# Patient Record
Sex: Male | Born: 1960 | Race: Black or African American | Hispanic: No | Marital: Single | Smoking: Former smoker
Health system: Southern US, Community
[De-identification: ages and names within clinical notes are randomized; demographics above are authoritative.]

## PROBLEM LIST (undated history)

## (undated) DIAGNOSIS — Z8619 Personal history of other infectious and parasitic diseases: Secondary | ICD-10-CM

## (undated) DIAGNOSIS — I10 Essential (primary) hypertension: Secondary | ICD-10-CM

## (undated) DIAGNOSIS — I252 Old myocardial infarction: Secondary | ICD-10-CM

## (undated) DIAGNOSIS — C801 Malignant (primary) neoplasm, unspecified: Secondary | ICD-10-CM

## (undated) HISTORY — DX: Personal history of other infectious and parasitic diseases: Z86.19

## (undated) HISTORY — DX: Old myocardial infarction: I25.2

---

## 2010-12-15 ENCOUNTER — Ambulatory Visit: Payer: Self-pay | Admitting: Internal Medicine

## 2010-12-20 ENCOUNTER — Inpatient Hospital Stay: Payer: Self-pay | Admitting: Internal Medicine

## 2010-12-22 ENCOUNTER — Inpatient Hospital Stay: Payer: Self-pay | Admitting: Internal Medicine

## 2010-12-23 HISTORY — PX: TRACHEOSTOMY: SUR1362

## 2010-12-24 LAB — PATHOLOGY REPORT

## 2010-12-30 ENCOUNTER — Ambulatory Visit: Payer: Self-pay | Admitting: Internal Medicine

## 2011-01-06 ENCOUNTER — Inpatient Hospital Stay: Payer: Self-pay | Admitting: Internal Medicine

## 2011-01-15 ENCOUNTER — Ambulatory Visit: Payer: Self-pay | Admitting: Internal Medicine

## 2011-01-16 ENCOUNTER — Inpatient Hospital Stay: Payer: Self-pay | Admitting: Internal Medicine

## 2011-02-02 ENCOUNTER — Inpatient Hospital Stay: Payer: Self-pay | Admitting: Internal Medicine

## 2011-02-11 ENCOUNTER — Inpatient Hospital Stay: Payer: Self-pay | Admitting: Internal Medicine

## 2011-02-14 ENCOUNTER — Ambulatory Visit: Payer: Self-pay | Admitting: Internal Medicine

## 2011-03-17 ENCOUNTER — Ambulatory Visit: Payer: Self-pay | Admitting: Internal Medicine

## 2011-03-18 LAB — BASIC METABOLIC PANEL
Calcium, Total: 8.5 mg/dL (ref 8.5–10.1)
Chloride: 107 mmol/L (ref 98–107)
Co2: 27 mmol/L (ref 21–32)
Creatinine: 1.28 mg/dL (ref 0.60–1.30)
Potassium: 3.6 mmol/L (ref 3.5–5.1)
Sodium: 141 mmol/L (ref 136–145)

## 2011-03-18 LAB — CBC CANCER CENTER
Basophil #: 0 x10 3/mm (ref 0.0–0.1)
Basophil %: 0.7 %
Eosinophil #: 0 x10 3/mm (ref 0.0–0.7)
Eosinophil %: 0.9 %
HCT: 29.9 % — ABNORMAL LOW (ref 40.0–52.0)
HGB: 10.3 g/dL — ABNORMAL LOW (ref 13.0–18.0)
Lymphocyte #: 0.8 x10 3/mm — ABNORMAL LOW (ref 1.0–3.6)
MCH: 33.5 pg (ref 26.0–34.0)
MCHC: 34.3 g/dL (ref 32.0–36.0)
MCV: 98 fL (ref 80–100)
Monocyte #: 0.3 x10 3/mm (ref 0.0–0.7)
Neutrophil #: 2.1 x10 3/mm (ref 1.4–6.5)
Platelet: 283 x10 3/mm (ref 150–440)
RBC: 3.06 10*6/uL — ABNORMAL LOW (ref 4.40–5.90)

## 2011-03-25 LAB — CBC CANCER CENTER
Eosinophil %: 0.5 %
HCT: 31 % — ABNORMAL LOW (ref 40.0–52.0)
Lymphocyte #: 0.5 x10 3/mm — ABNORMAL LOW (ref 1.0–3.6)
MCH: 33.6 pg (ref 26.0–34.0)
MCHC: 33.9 g/dL (ref 32.0–36.0)
MCV: 99 fL (ref 80–100)
Monocyte #: 0.4 x10 3/mm (ref 0.0–0.7)
Monocyte %: 10.8 %
Neutrophil #: 2.8 x10 3/mm (ref 1.4–6.5)
Platelet: 257 x10 3/mm (ref 150–440)
RBC: 3.13 10*6/uL — ABNORMAL LOW (ref 4.40–5.90)

## 2011-03-25 LAB — BASIC METABOLIC PANEL
Anion Gap: 8 (ref 7–16)
BUN: 15 mg/dL (ref 7–18)
Calcium, Total: 8.6 mg/dL (ref 8.5–10.1)
Creatinine: 1.14 mg/dL (ref 0.60–1.30)
Glucose: 97 mg/dL (ref 65–99)
Sodium: 141 mmol/L (ref 136–145)

## 2011-04-01 LAB — BASIC METABOLIC PANEL
BUN: 16 mg/dL (ref 7–18)
Calcium, Total: 8.8 mg/dL (ref 8.5–10.1)
Creatinine: 1.14 mg/dL (ref 0.60–1.30)
EGFR (African American): >60
EGFR (Non-African Amer.): >60
Glucose: 106 mg/dL — ABNORMAL HIGH (ref 65–99)
Potassium: 3.9 mmol/L (ref 3.5–5.1)
Sodium: 140 mmol/L (ref 136–145)

## 2011-04-01 LAB — CBC CANCER CENTER
Basophil #: 0 x10 3/mm (ref 0.0–0.1)
Eosinophil #: 0 x10 3/mm (ref 0.0–0.7)
Eosinophil %: 0.4 %
Lymphocyte #: 0.3 x10 3/mm — ABNORMAL LOW (ref 1.0–3.6)
MCH: 33.3 pg (ref 26.0–34.0)
MCHC: 33.3 g/dL (ref 32.0–36.0)
MCV: 100 fL (ref 80–100)
Monocyte #: 0.3 x10 3/mm (ref 0.0–0.7)
Monocyte %: 7.8 %
Platelet: 266 x10 3/mm (ref 150–440)
RBC: 3.29 10*6/uL — ABNORMAL LOW (ref 4.40–5.90)
RDW: 16.5 % — ABNORMAL HIGH (ref 11.5–14.5)

## 2011-04-08 LAB — BASIC METABOLIC PANEL
Anion Gap: 7 (ref 7–16)
Calcium, Total: 8.8 mg/dL (ref 8.5–10.1)
Chloride: 105 mmol/L (ref 98–107)
Co2: 29 mmol/L (ref 21–32)
Creatinine: 1.08 mg/dL (ref 0.60–1.30)
Potassium: 3.9 mmol/L (ref 3.5–5.1)

## 2011-04-08 LAB — CBC CANCER CENTER
Eosinophil %: 0.8 %
Lymphocyte #: 0.3 x10 3/mm — ABNORMAL LOW (ref 1.0–3.6)
MCV: 100 fL (ref 80–100)
Monocyte %: 13.5 %
Neutrophil %: 75.5 %
Platelet: 251 x10 3/mm (ref 150–440)
RBC: 3.16 10*6/uL — ABNORMAL LOW (ref 4.40–5.90)
WBC: 2.8 x10 3/mm — ABNORMAL LOW (ref 3.8–10.6)

## 2011-04-15 LAB — HEPATIC FUNCTION PANEL A (ARMC)
Alkaline Phosphatase: 88 U/L (ref 50–136)
Bilirubin, Direct: 0.1 mg/dL (ref 0.00–0.20)
Bilirubin,Total: 0.2 mg/dL (ref 0.2–1.0)
SGOT(AST): 17 U/L (ref 15–37)
SGPT (ALT): 18 U/L

## 2011-04-15 LAB — BASIC METABOLIC PANEL
Anion Gap: 10 (ref 7–16)
Calcium, Total: 8.6 mg/dL (ref 8.5–10.1)
Co2: 26 mmol/L (ref 21–32)
Creatinine: 1.09 mg/dL (ref 0.60–1.30)
EGFR (African American): >60
Osmolality: 286 (ref 275–301)
Potassium: 3.7 mmol/L (ref 3.5–5.1)

## 2011-04-15 LAB — CBC CANCER CENTER
Basophil #: 0 x10 3/mm (ref 0.0–0.1)
Eosinophil #: 0 x10 3/mm (ref 0.0–0.7)
Eosinophil %: 1.1 %
HCT: 31.4 % — ABNORMAL LOW (ref 40.0–52.0)
Lymphocyte #: 0.2 x10 3/mm — ABNORMAL LOW (ref 1.0–3.6)
MCH: 34.6 pg — ABNORMAL HIGH (ref 26.0–34.0)
MCHC: 34.4 g/dL (ref 32.0–36.0)
MCV: 101 fL — ABNORMAL HIGH (ref 80–100)
Monocyte #: 0.4 x10 3/mm (ref 0.0–0.7)
Neutrophil #: 1.8 x10 3/mm (ref 1.4–6.5)
Neutrophil %: 73.8 %
Platelet: 182 x10 3/mm (ref 150–440)
RDW: 15.3 % — ABNORMAL HIGH (ref 11.5–14.5)
WBC: 2.5 x10 3/mm — ABNORMAL LOW (ref 3.8–10.6)

## 2011-04-17 ENCOUNTER — Ambulatory Visit: Payer: Self-pay | Admitting: Internal Medicine

## 2011-04-22 LAB — CBC CANCER CENTER
Basophil %: 0 %
Eosinophil #: 0 x10 3/mm (ref 0.0–0.7)
HCT: 32.1 % — ABNORMAL LOW (ref 40.0–52.0)
HGB: 11 g/dL — ABNORMAL LOW (ref 13.0–18.0)
Lymphocyte %: 5.1 %
Monocyte %: 8.3 %
Neutrophil #: 2.4 x10 3/mm (ref 1.4–6.5)
Neutrophil %: 86.2 %
Platelet: 195 x10 3/mm (ref 150–440)
RDW: 14.8 % — ABNORMAL HIGH (ref 11.5–14.5)
WBC: 2.8 x10 3/mm — ABNORMAL LOW (ref 3.8–10.6)

## 2011-04-22 LAB — BASIC METABOLIC PANEL
Anion Gap: 6 — ABNORMAL LOW (ref 7–16)
BUN: 20 mg/dL — ABNORMAL HIGH (ref 7–18)
Chloride: 104 mmol/L (ref 98–107)
Co2: 30 mmol/L (ref 21–32)
Creatinine: 1.08 mg/dL (ref 0.60–1.30)
Osmolality: 283 (ref 275–301)
Potassium: 4 mmol/L (ref 3.5–5.1)

## 2011-04-29 LAB — CBC CANCER CENTER
Basophil #: 0 x10 3/mm (ref 0.0–0.1)
Eosinophil #: 0 x10 3/mm (ref 0.0–0.7)
HCT: 31.6 % — ABNORMAL LOW (ref 40.0–52.0)
Lymphocyte %: 7.8 %
MCHC: 34.5 g/dL (ref 32.0–36.0)
MCV: 100 fL (ref 80–100)
Monocyte %: 9.9 %
Neutrophil #: 1.4 x10 3/mm (ref 1.4–6.5)
Neutrophil %: 81.5 %
Platelet: 191 x10 3/mm (ref 150–440)
RBC: 3.16 10*6/uL — ABNORMAL LOW (ref 4.40–5.90)
RDW: 14.3 % (ref 11.5–14.5)
WBC: 1.7 x10 3/mm — CL (ref 3.8–10.6)

## 2011-04-29 LAB — HEPATIC FUNCTION PANEL A (ARMC)
Albumin: 3.4 g/dL (ref 3.4–5.0)
Bilirubin, Direct: 0.1 mg/dL (ref 0.00–0.20)
Bilirubin,Total: 0.4 mg/dL (ref 0.2–1.0)
SGPT (ALT): 22 U/L
Total Protein: 6.9 g/dL (ref 6.4–8.2)

## 2011-04-29 LAB — BASIC METABOLIC PANEL
Anion Gap: 6 — ABNORMAL LOW (ref 7–16)
BUN: 16 mg/dL (ref 7–18)
Creatinine: 1.15 mg/dL (ref 0.60–1.30)
EGFR (African American): >60
EGFR (Non-African Amer.): >60
Osmolality: 281 (ref 275–301)

## 2011-05-06 LAB — BASIC METABOLIC PANEL
Anion Gap: 5 — ABNORMAL LOW (ref 7–16)
BUN: 18 mg/dL (ref 7–18)
Chloride: 103 mmol/L (ref 98–107)
Creatinine: 1.05 mg/dL (ref 0.60–1.30)
EGFR (African American): >60
EGFR (Non-African Amer.): >60
Glucose: 121 mg/dL — ABNORMAL HIGH (ref 65–99)
Sodium: 138 mmol/L (ref 136–145)

## 2011-05-06 LAB — CBC CANCER CENTER
Basophil %: 0.2 %
Eosinophil #: 0 x10 3/mm (ref 0.0–0.7)
HCT: 31.8 % — ABNORMAL LOW (ref 40.0–52.0)
HGB: 11 g/dL — ABNORMAL LOW (ref 13.0–18.0)
Lymphocyte #: 0.1 x10 3/mm — ABNORMAL LOW (ref 1.0–3.6)
Lymphocyte %: 4.7 %
MCV: 100 fL (ref 80–100)
Monocyte #: 0.1 x10 3/mm (ref 0.0–0.7)
Monocyte %: 7.2 %
Neutrophil #: 1.8 x10 3/mm (ref 1.4–6.5)
RBC: 3.18 10*6/uL — ABNORMAL LOW (ref 4.40–5.90)
RDW: 14.2 % (ref 11.5–14.5)
WBC: 2 x10 3/mm — CL (ref 3.8–10.6)

## 2011-05-14 LAB — CBC CANCER CENTER
Basophil %: 0 %
Eosinophil %: 0.2 %
HCT: 32.6 % — ABNORMAL LOW (ref 40.0–52.0)
HGB: 11.2 g/dL — ABNORMAL LOW (ref 13.0–18.0)
Lymphocyte #: 0.1 x10 3/mm — ABNORMAL LOW (ref 1.0–3.6)
Lymphocyte %: 5.1 %
MCH: 34.3 pg — ABNORMAL HIGH (ref 26.0–34.0)
MCV: 100 fL (ref 80–100)
Neutrophil %: 82.8 %
RBC: 3.25 10*6/uL — ABNORMAL LOW (ref 4.40–5.90)

## 2011-05-14 LAB — BASIC METABOLIC PANEL
Anion Gap: 6 — ABNORMAL LOW (ref 7–16)
Calcium, Total: 8.8 mg/dL (ref 8.5–10.1)
Chloride: 103 mmol/L (ref 98–107)
Co2: 33 mmol/L — ABNORMAL HIGH (ref 21–32)
Creatinine: 1.17 mg/dL (ref 0.60–1.30)
EGFR (African American): >60
EGFR (Non-African Amer.): >60
Osmolality: 284 (ref 275–301)
Sodium: 142 mmol/L (ref 136–145)

## 2011-05-15 ENCOUNTER — Ambulatory Visit: Payer: Self-pay | Admitting: Internal Medicine

## 2011-06-10 LAB — HEPATIC FUNCTION PANEL A (ARMC)
Albumin: 3.5 g/dL (ref 3.4–5.0)
Alkaline Phosphatase: 95 U/L (ref 50–136)
Bilirubin, Direct: 0.1 mg/dL (ref 0.00–0.20)
Total Protein: 7.1 g/dL (ref 6.4–8.2)

## 2011-06-10 LAB — CBC CANCER CENTER
HGB: 10.8 g/dL — ABNORMAL LOW (ref 13.0–18.0)
Lymphocyte #: 0.2 x10 3/mm — ABNORMAL LOW (ref 1.0–3.6)
MCH: 34.1 pg — ABNORMAL HIGH (ref 26.0–34.0)
MCHC: 34 g/dL (ref 32.0–36.0)
MCV: 100 fL (ref 80–100)
Monocyte #: 0.3 x10 3/mm (ref 0.0–0.7)
Monocyte %: 13.3 %
Platelet: 258 x10 3/mm (ref 150–440)
RDW: 16.2 % — ABNORMAL HIGH (ref 11.5–14.5)

## 2011-06-10 LAB — BASIC METABOLIC PANEL
Anion Gap: 7 (ref 7–16)
BUN: 19 mg/dL — ABNORMAL HIGH (ref 7–18)
Calcium, Total: 8.6 mg/dL (ref 8.5–10.1)
Chloride: 104 mmol/L (ref 98–107)
EGFR (African American): >60
EGFR (Non-African Amer.): >60
Osmolality: 282 (ref 275–301)
Potassium: 3.7 mmol/L (ref 3.5–5.1)
Sodium: 140 mmol/L (ref 136–145)

## 2011-06-15 ENCOUNTER — Ambulatory Visit: Payer: Self-pay | Admitting: Internal Medicine

## 2011-07-15 ENCOUNTER — Ambulatory Visit: Payer: Self-pay

## 2011-07-15 ENCOUNTER — Ambulatory Visit: Payer: Self-pay | Admitting: Internal Medicine

## 2011-09-07 ENCOUNTER — Ambulatory Visit: Payer: Self-pay | Admitting: Internal Medicine

## 2011-09-07 LAB — CBC CANCER CENTER
Basophil %: 1.1 %
Eosinophil %: 2.8 %
HGB: 12.5 g/dL — ABNORMAL LOW (ref 13.0–18.0)
Lymphocyte %: 9.8 %
MCH: 32.7 pg (ref 26.0–34.0)
MCV: 100 fL (ref 80–100)
Neutrophil #: 2.5 x10 3/mm (ref 1.4–6.5)
Platelet: 183 x10 3/mm (ref 150–440)
WBC: 3.3 x10 3/mm — ABNORMAL LOW (ref 3.8–10.6)

## 2011-09-07 LAB — BASIC METABOLIC PANEL
Anion Gap: 5 — ABNORMAL LOW (ref 7–16)
BUN: 15 mg/dL (ref 7–18)
Calcium, Total: 8.6 mg/dL (ref 8.5–10.1)
Chloride: 104 mmol/L (ref 98–107)
Co2: 30 mmol/L (ref 21–32)
Creatinine: 1.14 mg/dL (ref 0.60–1.30)
EGFR (African American): >60
EGFR (Non-African Amer.): >60
Glucose: 96 mg/dL (ref 65–99)
Potassium: 3.7 mmol/L (ref 3.5–5.1)

## 2011-09-07 LAB — HEPATIC FUNCTION PANEL A (ARMC)
Albumin: 3.6 g/dL (ref 3.4–5.0)
Alkaline Phosphatase: 91 U/L (ref 50–136)
Bilirubin, Direct: 0.1 mg/dL (ref 0.00–0.20)
SGOT(AST): 25 U/L (ref 15–37)
SGPT (ALT): 29 U/L
Total Protein: 7.3 g/dL (ref 6.4–8.2)

## 2011-09-11 ENCOUNTER — Emergency Department: Payer: Self-pay | Admitting: Emergency Medicine

## 2011-09-11 LAB — CREATININE, SERUM: Creatinine: 1.49 mg/dL — ABNORMAL HIGH (ref 0.60–1.30)

## 2011-09-11 LAB — BASIC METABOLIC PANEL
Calcium, Total: 8.9 mg/dL (ref 8.5–10.1)
Chloride: 104 mmol/L (ref 98–107)
Co2: 27 mmol/L (ref 21–32)
EGFR (African American): >60
Glucose: 96 mg/dL (ref 65–99)
Osmolality: 279 (ref 275–301)
Potassium: 3.8 mmol/L (ref 3.5–5.1)
Sodium: 139 mmol/L (ref 136–145)

## 2011-09-14 ENCOUNTER — Ambulatory Visit: Payer: Self-pay | Admitting: Internal Medicine

## 2011-10-21 ENCOUNTER — Ambulatory Visit: Payer: Self-pay | Admitting: Internal Medicine

## 2011-11-15 ENCOUNTER — Ambulatory Visit: Payer: Self-pay | Admitting: Internal Medicine

## 2012-01-25 ENCOUNTER — Ambulatory Visit: Payer: Self-pay | Admitting: Internal Medicine

## 2012-01-25 LAB — CBC CANCER CENTER
Basophil #: 0 x10 3/mm (ref 0.0–0.1)
Eosinophil #: 0 x10 3/mm (ref 0.0–0.7)
MCH: 32.6 pg (ref 26.0–34.0)
MCV: 100 fL (ref 80–100)
Monocyte #: 0.4 x10 3/mm (ref 0.2–1.0)
Platelet: 187 x10 3/mm (ref 150–440)
RDW: 13 % (ref 11.5–14.5)

## 2012-01-25 LAB — BASIC METABOLIC PANEL
Anion Gap: 9 (ref 7–16)
BUN: 18 mg/dL (ref 7–18)
Calcium, Total: 8.9 mg/dL (ref 8.5–10.1)
Co2: 27 mmol/L (ref 21–32)
EGFR (African American): >60
EGFR (Non-African Amer.): >60
Glucose: 97 mg/dL (ref 65–99)
Potassium: 4 mmol/L (ref 3.5–5.1)
Sodium: 139 mmol/L (ref 136–145)

## 2012-01-25 LAB — HEPATIC FUNCTION PANEL A (ARMC)
Albumin: 3.6 g/dL (ref 3.4–5.0)
Alkaline Phosphatase: 87 U/L (ref 50–136)
Bilirubin, Direct: 0.1 mg/dL (ref 0.00–0.20)
SGPT (ALT): 33 U/L (ref 12–78)
Total Protein: 7.2 g/dL (ref 6.4–8.2)

## 2012-02-14 ENCOUNTER — Ambulatory Visit: Payer: Self-pay | Admitting: Internal Medicine

## 2012-04-18 ENCOUNTER — Ambulatory Visit: Payer: Self-pay | Admitting: Radiation Oncology

## 2012-05-14 ENCOUNTER — Ambulatory Visit: Payer: Self-pay | Admitting: Radiation Oncology

## 2012-05-14 ENCOUNTER — Ambulatory Visit: Payer: Self-pay | Admitting: Internal Medicine

## 2012-05-16 LAB — CBC CANCER CENTER
Basophil #: 0 x10 3/mm (ref 0.0–0.1)
Eosinophil #: 0.1 x10 3/mm (ref 0.0–0.7)
HCT: 38.2 % — ABNORMAL LOW (ref 40.0–52.0)
HGB: 12.7 g/dL — ABNORMAL LOW (ref 13.0–18.0)
MCH: 32.4 pg (ref 26.0–34.0)
MCHC: 33.3 g/dL (ref 32.0–36.0)
MCV: 97 fL (ref 80–100)
Monocyte %: 10.2 %
WBC: 2.9 x10 3/mm — ABNORMAL LOW (ref 3.8–10.6)

## 2012-05-16 LAB — CREATININE, SERUM
EGFR (African American): >60
EGFR (Non-African Amer.): >60

## 2012-05-16 LAB — HEPATIC FUNCTION PANEL A (ARMC)
Alkaline Phosphatase: 84 U/L (ref 50–136)
Bilirubin, Direct: 0.1 mg/dL (ref 0.00–0.20)
Bilirubin,Total: 0.5 mg/dL (ref 0.2–1.0)

## 2012-05-16 LAB — CALCIUM: Calcium, Total: 8.7 mg/dL (ref 8.5–10.1)

## 2012-06-14 ENCOUNTER — Ambulatory Visit: Payer: Self-pay | Admitting: Internal Medicine

## 2012-06-14 ENCOUNTER — Ambulatory Visit: Payer: Self-pay | Admitting: Radiation Oncology

## 2012-07-14 ENCOUNTER — Ambulatory Visit: Payer: Self-pay | Admitting: Internal Medicine

## 2012-09-13 ENCOUNTER — Ambulatory Visit: Payer: Self-pay | Admitting: Internal Medicine

## 2012-09-23 LAB — CBC CANCER CENTER
Basophil #: 0 x10 3/mm (ref 0.0–0.1)
Eosinophil %: 0.9 %
HCT: 40.8 % (ref 40.0–52.0)
HGB: 14 g/dL (ref 13.0–18.0)
Lymphocyte #: 0.4 x10 3/mm — ABNORMAL LOW (ref 1.0–3.6)
MCH: 33 pg (ref 26.0–34.0)
MCHC: 34.3 g/dL (ref 32.0–36.0)
Monocyte #: 0.4 x10 3/mm (ref 0.2–1.0)
Neutrophil #: 2.5 x10 3/mm (ref 1.4–6.5)
Platelet: 200 x10 3/mm (ref 150–440)
RDW: 13.6 % (ref 11.5–14.5)
WBC: 3.4 x10 3/mm — ABNORMAL LOW (ref 3.8–10.6)

## 2012-09-23 LAB — HEPATIC FUNCTION PANEL A (ARMC)
Albumin: 3.7 g/dL (ref 3.4–5.0)
Bilirubin, Direct: 0.1 mg/dL (ref 0.00–0.20)
Bilirubin,Total: 0.5 mg/dL (ref 0.2–1.0)
SGPT (ALT): 32 U/L (ref 12–78)
Total Protein: 7.3 g/dL (ref 6.4–8.2)

## 2012-09-23 LAB — CREATININE, SERUM
Creatinine: 1.38 mg/dL — ABNORMAL HIGH (ref 0.60–1.30)
EGFR (African American): >60
EGFR (Non-African Amer.): 58 — ABNORMAL LOW

## 2012-09-23 LAB — CALCIUM: Calcium, Total: 8.7 mg/dL (ref 8.5–10.1)

## 2012-10-14 ENCOUNTER — Ambulatory Visit: Payer: Self-pay | Admitting: Internal Medicine

## 2012-11-16 ENCOUNTER — Ambulatory Visit: Payer: Self-pay | Admitting: Internal Medicine

## 2013-01-24 ENCOUNTER — Ambulatory Visit: Payer: Self-pay | Admitting: Internal Medicine

## 2013-01-24 LAB — CBC CANCER CENTER
Basophil #: 0 x10 3/mm (ref 0.0–0.1)
Basophil %: 1 %
Eosinophil #: 0.1 x10 3/mm (ref 0.0–0.7)
Eosinophil %: 2.6 %
HCT: 40.9 % (ref 40.0–52.0)
HGB: 13.4 g/dL (ref 13.0–18.0)
Lymphocyte #: 0.5 x10 3/mm — ABNORMAL LOW (ref 1.0–3.6)
MCH: 31.7 pg (ref 26.0–34.0)
MCV: 97 fL (ref 80–100)
Monocyte #: 0.4 x10 3/mm (ref 0.2–1.0)
Monocyte %: 11.9 %
Neutrophil #: 2.3 x10 3/mm (ref 1.4–6.5)
Neutrophil %: 70.1 %
Platelet: 225 x10 3/mm (ref 150–440)
RBC: 4.22 10*6/uL — ABNORMAL LOW (ref 4.40–5.90)
WBC: 3.3 x10 3/mm — ABNORMAL LOW (ref 3.8–10.6)

## 2013-01-24 LAB — CREATININE, SERUM
EGFR (African American): >60
EGFR (Non-African Amer.): >60

## 2013-01-24 LAB — HEPATIC FUNCTION PANEL A (ARMC)
Bilirubin,Total: 0.6 mg/dL (ref 0.2–1.0)
SGOT(AST): 25 U/L (ref 15–37)
Total Protein: 7.5 g/dL (ref 6.4–8.2)

## 2013-01-24 LAB — CALCIUM: Calcium, Total: 8.8 mg/dL (ref 8.5–10.1)

## 2013-02-11 ENCOUNTER — Emergency Department: Payer: Self-pay | Admitting: Internal Medicine

## 2013-02-11 LAB — CBC WITH DIFFERENTIAL/PLATELET
Basophil %: 0.2 %
Eosinophil #: 0 10*3/uL (ref 0.0–0.7)
HCT: 49.3 % (ref 40.0–52.0)
HGB: 16.3 g/dL (ref 13.0–18.0)
Lymphocyte #: 0.2 10*3/uL — ABNORMAL LOW (ref 1.0–3.6)
Lymphocyte %: 1.3 %
MCH: 31.5 pg (ref 26.0–34.0)
MCHC: 33.1 g/dL (ref 32.0–36.0)
MCV: 95 fL (ref 80–100)
Monocyte %: 2.8 %
Neutrophil #: 12.1 10*3/uL — ABNORMAL HIGH (ref 1.4–6.5)
Neutrophil %: 95.7 %
Platelet: 272 10*3/uL (ref 150–440)
RBC: 5.18 10*6/uL (ref 4.40–5.90)

## 2013-02-11 LAB — COMPREHENSIVE METABOLIC PANEL
Albumin: 4.7 g/dL (ref 3.4–5.0)
Alkaline Phosphatase: 103 U/L
Anion Gap: 9 (ref 7–16)
BUN: 24 mg/dL — ABNORMAL HIGH (ref 7–18)
Chloride: 100 mmol/L (ref 98–107)
Co2: 23 mmol/L (ref 21–32)
Creatinine: 1.33 mg/dL — ABNORMAL HIGH (ref 0.60–1.30)
EGFR (African American): >60
EGFR (Non-African Amer.): >60
Glucose: 163 mg/dL — ABNORMAL HIGH (ref 65–99)
Osmolality: 272 (ref 275–301)
Potassium: 3.7 mmol/L (ref 3.5–5.1)
SGOT(AST): 20 U/L (ref 15–37)
SGPT (ALT): 31 U/L (ref 12–78)
Total Protein: 9.1 g/dL — ABNORMAL HIGH (ref 6.4–8.2)

## 2013-02-11 LAB — URINALYSIS, COMPLETE
Leukocyte Esterase: NEGATIVE
Nitrite: NEGATIVE
Protein: 100
RBC,UR: <1 /HPF (ref 0–5)
Specific Gravity: 1.023 (ref 1.003–1.030)

## 2013-02-13 ENCOUNTER — Ambulatory Visit: Payer: Self-pay | Admitting: Internal Medicine

## 2013-03-16 ENCOUNTER — Ambulatory Visit: Payer: Self-pay | Admitting: Internal Medicine

## 2013-08-14 ENCOUNTER — Ambulatory Visit: Payer: Self-pay | Admitting: Radiation Oncology

## 2013-08-14 LAB — CBC CANCER CENTER
BASOS ABS: 0 x10 3/mm (ref 0.0–0.1)
BASOS PCT: 1.2 %
EOS ABS: 0 x10 3/mm (ref 0.0–0.7)
Eosinophil %: 1.3 %
HCT: 41 % (ref 40.0–52.0)
HGB: 13.7 g/dL (ref 13.0–18.0)
LYMPHS ABS: 0.5 x10 3/mm — AB (ref 1.0–3.6)
Lymphocyte %: 20.3 %
MCH: 32.4 pg (ref 26.0–34.0)
MCHC: 33.4 g/dL (ref 32.0–36.0)
MCV: 97 fL (ref 80–100)
Monocyte #: 0.4 x10 3/mm (ref 0.2–1.0)
Monocyte %: 14.1 %
NEUTROS ABS: 1.7 x10 3/mm (ref 1.4–6.5)
Neutrophil %: 63.1 %
Platelet: 236 x10 3/mm (ref 150–440)
RBC: 4.23 10*6/uL — ABNORMAL LOW (ref 4.40–5.90)
RDW: 13.9 % (ref 11.5–14.5)
WBC: 2.7 x10 3/mm — ABNORMAL LOW (ref 3.8–10.6)

## 2013-08-14 LAB — HEPATIC FUNCTION PANEL A (ARMC)
ALBUMIN: 4 g/dL (ref 3.4–5.0)
ALT: 31 U/L (ref 12–78)
Alkaline Phosphatase: 86 U/L
Bilirubin, Direct: 0.1 mg/dL (ref 0.00–0.20)
Bilirubin,Total: 0.3 mg/dL (ref 0.2–1.0)
SGOT(AST): 24 U/L (ref 15–37)
Total Protein: 7.8 g/dL (ref 6.4–8.2)

## 2013-08-14 LAB — CALCIUM: CALCIUM: 9 mg/dL (ref 8.5–10.1)

## 2013-08-14 LAB — CREATININE, SERUM
Creatinine: 1.28 mg/dL (ref 0.60–1.30)
EGFR (Non-African Amer.): >60

## 2013-09-13 ENCOUNTER — Ambulatory Visit: Payer: Self-pay | Admitting: Radiation Oncology

## 2014-02-19 ENCOUNTER — Ambulatory Visit: Payer: Self-pay | Admitting: Internal Medicine

## 2014-02-21 ENCOUNTER — Ambulatory Visit: Payer: Self-pay | Admitting: Internal Medicine

## 2014-02-21 LAB — CBC CANCER CENTER
BASOS PCT: 0.9 %
Basophil #: 0 x10 3/mm (ref 0.0–0.1)
EOS PCT: 0.5 %
Eosinophil #: 0 x10 3/mm (ref 0.0–0.7)
HCT: 43.8 % (ref 40.0–52.0)
HGB: 14.5 g/dL (ref 13.0–18.0)
LYMPHS PCT: 16.7 %
Lymphocyte #: 0.6 x10 3/mm — ABNORMAL LOW (ref 1.0–3.6)
MCH: 32.5 pg (ref 26.0–34.0)
MCHC: 33.2 g/dL (ref 32.0–36.0)
MCV: 98 fL (ref 80–100)
MONO ABS: 0.5 x10 3/mm (ref 0.2–1.0)
Monocyte %: 14.4 %
NEUTROS PCT: 67.5 %
Neutrophil #: 2.4 x10 3/mm (ref 1.4–6.5)
PLATELETS: 249 x10 3/mm (ref 150–440)
RBC: 4.46 10*6/uL (ref 4.40–5.90)
RDW: 13.6 % (ref 11.5–14.5)
WBC: 3.5 x10 3/mm — ABNORMAL LOW (ref 3.8–10.6)

## 2014-02-21 LAB — HEPATIC FUNCTION PANEL A (ARMC)
AST: 31 U/L (ref 15–37)
Albumin: 3.8 g/dL (ref 3.4–5.0)
Alkaline Phosphatase: 86 U/L
Bilirubin,Total: 0.6 mg/dL (ref 0.2–1.0)
SGPT (ALT): 29 U/L
TOTAL PROTEIN: 7.6 g/dL (ref 6.4–8.2)

## 2014-02-21 LAB — CREATININE, SERUM
Creatinine: 1.15 mg/dL (ref 0.60–1.30)
EGFR (Non-African Amer.): >60

## 2014-02-21 LAB — CALCIUM: CALCIUM: 8.7 mg/dL (ref 8.5–10.1)

## 2014-03-16 ENCOUNTER — Ambulatory Visit: Payer: Self-pay | Admitting: Internal Medicine

## 2014-08-22 ENCOUNTER — Ambulatory Visit: Payer: Self-pay | Admitting: Internal Medicine

## 2014-08-22 ENCOUNTER — Other Ambulatory Visit: Payer: Self-pay

## 2014-08-22 ENCOUNTER — Ambulatory Visit: Payer: Self-pay | Admitting: Radiation Oncology

## 2014-09-20 ENCOUNTER — Other Ambulatory Visit: Payer: Self-pay | Admitting: Family Medicine

## 2014-09-20 NOTE — Telephone Encounter (Signed)
Pt contacted office for refill request on the following medications:  Amlodipine Besylate 10mg  and Ramipril 10mg .  [place].  [phone]

## 2014-09-21 MED ORDER — FLUTICASONE PROPIONATE 50 MCG/ACT NA SUSP: 2.0000 | Freq: Every day | NASAL | Status: DC

## 2014-09-21 MED ORDER — RAMIPRIL 10 MG PO CAPS: 10.0000 mg | ORAL_CAPSULE | Freq: Every day | ORAL | Status: DC

## 2014-09-21 MED ORDER — AMLODIPINE BESYLATE 10 MG PO TABS: 10.0000 mg | ORAL_TABLET | Freq: Every day | ORAL | Status: DC

## 2014-09-21 NOTE — Telephone Encounter (Signed)
Last filled by MD on- Fluticasone-04/26/2014 16g x1; Ramipril 10 mg-04/13/2014 #30 x0; Amlodipine 10 mg-04/13/2014 #30 x0 Last Appt: 04/26/2014  Next Appt: none Please advise refill?

## 2014-09-24 ENCOUNTER — Other Ambulatory Visit: Payer: Self-pay | Admitting: Family Medicine

## 2014-09-24 ENCOUNTER — Telehealth: Payer: Self-pay | Admitting: Family Medicine

## 2014-09-24 MED ORDER — RAMIPRIL 10 MG PO CAPS: 10.0000 mg | ORAL_CAPSULE | Freq: Every day | ORAL | Status: DC

## 2014-09-24 MED ORDER — FLUTICASONE PROPIONATE 50 MCG/ACT NA SUSP: 2.0000 | Freq: Every day | NASAL | Status: AC

## 2014-09-24 MED ORDER — AMLODIPINE BESYLATE 10 MG PO TABS: 10.0000 mg | ORAL_TABLET | Freq: Every day | ORAL | Status: DC

## 2014-09-24 NOTE — Telephone Encounter (Signed)
Resent rx's to pharmacy.

## 2014-09-24 NOTE — Telephone Encounter (Signed)
Pt contacted office for refill request on the following medications: To [facility] [address]  amLODipine (NORVASC) 10 MG tablet  fluticasone (FLONASE) 50 MCG/ACT nasal spray  ramipril (ALTACE) 10 MG capsule Thanks TNP

## 2014-10-17 ENCOUNTER — Encounter: Payer: Self-pay | Admitting: Radiation Oncology

## 2014-10-17 ENCOUNTER — Other Ambulatory Visit: Payer: Self-pay | Admitting: *Deleted

## 2014-10-17 ENCOUNTER — Inpatient Hospital Stay: Payer: Medicaid Other | Attending: Internal Medicine

## 2014-10-17 ENCOUNTER — Inpatient Hospital Stay (HOSPITAL_BASED_OUTPATIENT_CLINIC_OR_DEPARTMENT_OTHER): Payer: Medicaid Other | Admitting: Internal Medicine

## 2014-10-17 ENCOUNTER — Ambulatory Visit
Admission: RE | Admit: 2014-10-17 | Discharge: 2014-10-17 | Disposition: A | Discharge status: Home / Self Care | Payer: Medicaid Other | Source: Ambulatory Visit | Attending: Radiation Oncology | Admitting: Radiation Oncology

## 2014-10-17 VITALS — BP 180/96 | HR 74 | Temp 98.2°F | Resp 18 | Ht 66.0 in | Wt 197.3 lb

## 2014-10-17 VITALS — BP 192/98 | HR 95 | Temp 97.3°F | Resp 20 | Ht 66.0 in | Wt 197.9 lb

## 2014-10-17 DIAGNOSIS — C321 Malignant neoplasm of supraglottis: Secondary | ICD-10-CM

## 2014-10-17 DIAGNOSIS — Z79899 Other long term (current) drug therapy: Secondary | ICD-10-CM | POA: Insufficient documentation

## 2014-10-17 DIAGNOSIS — C329 Malignant neoplasm of larynx, unspecified: Secondary | ICD-10-CM

## 2014-10-17 DIAGNOSIS — I10 Essential (primary) hypertension: Secondary | ICD-10-CM | POA: Insufficient documentation

## 2014-10-17 DIAGNOSIS — I252 Old myocardial infarction: Secondary | ICD-10-CM | POA: Diagnosis not present

## 2014-10-17 DIAGNOSIS — Z8521 Personal history of malignant neoplasm of larynx: Secondary | ICD-10-CM | POA: Diagnosis not present

## 2014-10-17 DIAGNOSIS — D72819 Decreased white blood cell count, unspecified: Secondary | ICD-10-CM

## 2014-10-17 DIAGNOSIS — Z87891 Personal history of nicotine dependence: Secondary | ICD-10-CM | POA: Insufficient documentation

## 2014-10-17 HISTORY — DX: Essential (primary) hypertension: I10

## 2014-10-17 HISTORY — DX: Malignant (primary) neoplasm, unspecified: C80.1

## 2014-10-17 LAB — CBC WITH DIFFERENTIAL/PLATELET
BASOS PCT: 2 %
Basophils Absolute: 0 10*3/uL (ref 0–0.1)
EOS ABS: 0.1 10*3/uL (ref 0–0.7)
EOS PCT: 3 %
HCT: 41.4 % (ref 40.0–52.0)
Hemoglobin: 13.9 g/dL (ref 13.0–18.0)
LYMPHS PCT: 22 %
Lymphs Abs: 0.5 10*3/uL — ABNORMAL LOW (ref 1.0–3.6)
MCH: 32.6 pg (ref 26.0–34.0)
MCHC: 33.6 g/dL (ref 32.0–36.0)
MCV: 96.9 fL (ref 80.0–100.0)
Monocytes Absolute: 0.3 10*3/uL (ref 0.2–1.0)
Monocytes Relative: 14 %
NEUTROS PCT: 59 %
Neutro Abs: 1.3 10*3/uL — ABNORMAL LOW (ref 1.4–6.5)
Platelets: 241 10*3/uL (ref 150–440)
RBC: 4.28 MIL/uL — ABNORMAL LOW (ref 4.40–5.90)
RDW: 13.7 % (ref 11.5–14.5)
WBC: 2.2 10*3/uL — AB (ref 3.8–10.6)

## 2014-10-17 LAB — HEPATIC FUNCTION PANEL
ALK PHOS: 70 U/L (ref 38–126)
ALT: 20 U/L (ref 17–63)
AST: 31 U/L (ref 15–41)
Albumin: 4.8 g/dL (ref 3.5–5.0)
Bilirubin, Direct: <0.1 mg/dL — ABNORMAL LOW (ref 0.1–0.5)
TOTAL PROTEIN: 7.9 g/dL (ref 6.5–8.1)
Total Bilirubin: 0.6 mg/dL (ref 0.3–1.2)

## 2014-10-17 LAB — CALCIUM: Calcium: 8.4 mg/dL — ABNORMAL LOW (ref 8.9–10.3)

## 2014-10-17 LAB — CREATININE, SERUM
Creatinine, Ser: 0.97 mg/dL (ref 0.61–1.24)
GFR calc Af Amer: >60 mL/min (ref >60)
GFR calc non Af Amer: >60 mL/min (ref >60)

## 2014-10-17 NOTE — Progress Notes (Signed)
Patient states that overall he has been feeling good. He saw Dr. Baruch Gouty this morning and he stated that things looked good. Patient states that sometimes when he eats certain foods it is difficult for him to swallow them.

## 2014-10-17 NOTE — Progress Notes (Signed)
Radiation Oncology Follow up Note  Name: Terrance Carter   Date:   10/17/2014 MRN:  [id number] DOB: 1961-01-28    This 54 y.o. male presents to the clinic today for follow-up for locally advanced head and neck cancer.  REFERRING PROVIDER: No ref. 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 He is seen today in routine follow-up and is doing well he continues follow-up care with ENT and has had no evidence of disease. He specifically denies head and neck pain. Does have slight dysphasia to large pieces of meat and he is careful to make sure food is cut up properly. He's had no choking episodes..patient voices still somewhat raspy but unchanged from prior exam.  COMPLICATIONS OF TREATMENT: none  FOLLOW UP COMPLIANCE: keeps appointments   PHYSICAL EXAM:  BP 192/98 mmHg  Pulse 95  Temp(Src) 97.3 F (36.3 C)  Resp 20  Ht 5\' 6"  (1.676 m)  Wt 197 lb 13.8 oz (89.75 kg)  BMI 31.95 kg/m2 Oral cavity is clear no oral mucosal lesions are identified indirect mirror examination shows upper airway clear cords approximating well vallecula and base of tongue within normal limits. There is still some shagginess around the true vocal cords. Macro physical exam  RADIOLOGY RESULTS: patient is scheduled for follow-up CT scan in January 2017  PLAN: present time he is doing well with no evidence of disease. He continues close follow-up with ENT and medical oncology. Scheduled for follow-up CT scan in January 2017. I am please was overall progress. I've asked to see him back in 1 year for follow-up at that time will discontinue follow-up care. Patient knows to call sooner with any concerns.  I would like to take this opportunity for allowing me to participate in the care of your patient.Armstead Peaks., MD

## 2014-10-24 NOTE — Progress Notes (Signed)
[place]  Telephone:(336) [phone] Fax:(336) [phone]     ID: Terrance Carter OB: 06-May-1960  MR#: [id number]  CSN#:[id number]  Patient Care Team: Birdie Sons, MD as PCP - General (Family Medicine)  CHIEF COMPLAINT/DIAGNOSIS:  T3/T4 squamous cell carcinoma of supraglottic area - completed 2 cycles of induction chemotherapy with Cisplatin/Taxotere/continuous IV 5-FU on 02/07/11 with excellent response. Completed radiation along with concurrent weekly cisplatin chemotherapy mid-February 2013.    HISTORY OF PRESENT ILLNESS:  Patient returns for continued oncology followup for history of laryngeal cancer. He was seen in Dec 2015. Denies any new pain in the neck or throat. No difficulty swallowing, changes in voice, or any new masses in the neck on self exam. No new dyspnea or stridor. Denies any headaches, imbalance, or falls. No new mood disturbances. No new dyspnea, chest pain, or hemoptysis. No new bone pains. Remains physically active.   REVIEW OF SYSTEMS:   ROS As in HPI above. In addition, no fever, chills or sweats. No new headaches or focal weakness.  No new mood disturbances. No  sore throat, cough, shortness of breath, sputum, hemoptysis or chest pain. No dizziness or palpitation. No abdominal pain, constipation, diarrhea, dysuria or hematuria. No new skin rash or bleeding symptoms. No new paresthesias in extremities. PS ECOG 0.  PAST MEDICAL HISTORY: Reviewed. Past Medical History  Diagnosis Date  . Cancer Laryngeal  . Hypertension           Hypertension.   h/o ?NSTEMI .   History of substance abuse   Supraglottic squamous cell Ca diagnosed Oct 2012 requiring tracheostomy on 12/23/10.  PAST SURGICAL HISTORY: Reviewed. Past Surgical History  Procedure Laterality Date  . Tracheostomy  12/23/10    FAMILY HISTORY: Reviewed. Noncontributory. Denies hematological disorders or malignancy.  SOCIAL HISTORY: Reviewed. Social History  Substance Use Topics  .  Smoking status: Former Research scientist (life sciences)  . Smokeless tobacco: Never Used  . Alcohol Use: Not on file  Has history of smoking. Denies alcohol or current recreational drug usage. Formerly abused marijuana, quit few months ago. He is a Therapist, nutritional by profession.  No Known Allergies  Current Outpatient Prescriptions  Medication Sig Dispense Refill  . amLODipine (NORVASC) 10 MG tablet Take 1 tablet (10 mg total) by mouth daily. 30 tablet 5  . fluticasone (FLONASE) 50 MCG/ACT nasal spray Place 2 sprays into both nostrils daily. 16 g 3  . ramipril (ALTACE) 10 MG capsule Take 1 capsule (10 mg total) by mouth daily. 30 capsule 6   No current facility-administered medications for this visit.    PHYSICAL EXAM: Filed Vitals:   10/17/14 1049  BP: 180/96  Pulse: 74  Temp: 98.2 F (36.8 C)  Resp: 18     Body mass index is 31.86 kg/(m^2).    ECOG FS:0 - Asymptomatic  GENERAL: Patient is alert and oriented and in no acute distress. There is no icterus. HEENT: EOMs intact. Oral exam negative for thrush or lesions. No cervical lymphadenopathy. CVS: S1S2, regular LUNGS: Bilaterally clear to auscultation, no rhonchi. ABDOMEN: Soft, nontender. No hepatomegaly clinically.  NEURO: grossly nonfocal, cranial nerves are intact.   EXTREMITIES: No pedal edema.  LAB RESULTS:    Component Value Date/Time   NA 132* 02/11/2013 0627   K 3.7 02/11/2013 0627   CL 100 02/11/2013 0627   CO2 23 02/11/2013 0627   GLUCOSE 163* 02/11/2013 0627   BUN 24* 02/11/2013 0627   CREATININE 0.97 10/17/2014 0939   CREATININE 1.15 02/21/2014 1210  CALCIUM 8.4* 10/17/2014 0939   CALCIUM 8.7 02/21/2014 1210   PROT 7.9 10/17/2014 0939   PROT 7.6 02/21/2014 1210   ALBUMIN 4.8 10/17/2014 0939   ALBUMIN 3.8 02/21/2014 1210   AST 31 10/17/2014 0939   AST 31 02/21/2014 1210   ALT 20 10/17/2014 0939   ALT 29 02/21/2014 1210   ALKPHOS 70 10/17/2014 0939   ALKPHOS 86 02/21/2014 1210   BILITOT 0.6 10/17/2014 0939   BILITOT 0.6  02/21/2014 1210   GFRNONAA >60 10/17/2014 0939   GFRNONAA >60 08/14/2013 1100   GFRAA >60 10/17/2014 0939   GFRAA >60 08/14/2013 1100    Lab Results  Component Value Date   WBC 2.2* 10/17/2014   NEUTROABS 1.3* 10/17/2014   HGB 13.9 10/17/2014   HCT 41.4 10/17/2014   MCV 96.9 10/17/2014   PLT 241 10/17/2014    STUDIES: 02/19/14 - CT scan of neck. IMPRESSION:  Diffuse thickening of the epiglottis and aryepiglottic folds. Prominent thickening of the glottic and immediate supraglottic region. Findings have an appearance suggestive of post therapy type changes relatively similar to the prior post therapy exam. No surrounding adenopathy. Significant dental disease lower molar roots bilaterally. Mild to slightly moderate mucosal thickening left sphenoid sinus air cell.   ASSESSMENT / PLAN:   1. 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 He is doing steady without any clinical evidence to suggest recurrent or metastatic malignancy. Appetite is good, no unintentional weight loss. Recent CT scan of the neck on December 7 was suggestive of post-therapy changes, no obvious recurrent or metastatic malignancy. Patient also continues to follow with ENT, Dr. Tami Ribas, on regular basis. Given that he is doing well, plan is continued surveillance, and we will see him back in 6 months with repeat labs including CBC, creatinine, calcium, LFT. Next CT scan will be planned in jan 2017 for annual surveillance.   2. Smoking Cessation - patient has quit smoking. 3. Chronic leucopenia and mild neutropenia - asymptomatic, continue to monitor. Have advised him to quit taking alcohol completely and will monitor. 4. In between visits he was advised to call in case of any new symptoms or acute sickness and will be evaluated sooner. He is agreeable  to this plan.    Leia Alf, MD   10/24/2014 11:23 PM

## 2014-11-22 ENCOUNTER — Telehealth: Payer: Self-pay

## 2014-11-22 NOTE — Telephone Encounter (Signed)
Called patient back informed patient we had an appointment available at 3:30 pm with Tawanna Sat per patient his ride couldn't bring him in today at all. Advised patient as stated below that doctor fisher recommends for him to go the urgent care. Patient said he will if not will see [place] tomorrow at 2:15  Thanks,  -Joseline

## 2014-11-22 NOTE — Telephone Encounter (Signed)
Patient was given an appointment tomorrow at 2:15 for Dizziness.  Triage patient, per patient is feeling dizzy, feels like he is going to faint. Drinking a lot of water. Took his Amlodopine blood pressure medicine. Per patient no chest pain or palpitations no other symptoms. No fever. Thinks probably something he ate might have something to do with it but sure.  Please advise.  Thanks,  -Eliot Bencivenga

## 2014-11-22 NOTE — Telephone Encounter (Signed)
Patient needs to come in this afternoon to see Tawanna Sat, or go to urgent care

## 2014-11-23 ENCOUNTER — Encounter: Payer: Self-pay | Admitting: Family Medicine

## 2014-11-23 ENCOUNTER — Other Ambulatory Visit: Payer: Self-pay | Admitting: Family Medicine

## 2014-11-23 ENCOUNTER — Ambulatory Visit (INDEPENDENT_AMBULATORY_CARE_PROVIDER_SITE_OTHER): Payer: Medicaid Other | Admitting: Family Medicine

## 2014-11-23 VITALS — BP 164/88 | HR 88 | Temp 97.9°F | Resp 16 | Ht 67.0 in | Wt 202.0 lb

## 2014-11-23 DIAGNOSIS — I10 Essential (primary) hypertension: Secondary | ICD-10-CM

## 2014-11-23 DIAGNOSIS — R42 Dizziness and giddiness: Secondary | ICD-10-CM | POA: Diagnosis not present

## 2014-11-23 DIAGNOSIS — H539 Unspecified visual disturbance: Secondary | ICD-10-CM | POA: Insufficient documentation

## 2014-11-23 DIAGNOSIS — C320 Malignant neoplasm of glottis: Secondary | ICD-10-CM | POA: Insufficient documentation

## 2014-11-23 DIAGNOSIS — R1310 Dysphagia, unspecified: Secondary | ICD-10-CM | POA: Insufficient documentation

## 2014-11-23 DIAGNOSIS — Z1211 Encounter for screening for malignant neoplasm of colon: Secondary | ICD-10-CM

## 2014-11-23 DIAGNOSIS — R079 Chest pain, unspecified: Secondary | ICD-10-CM | POA: Insufficient documentation

## 2014-11-23 DIAGNOSIS — D2262 Melanocytic nevi of left upper limb, including shoulder: Secondary | ICD-10-CM | POA: Insufficient documentation

## 2014-11-23 DIAGNOSIS — Z8521 Personal history of malignant neoplasm of larynx: Secondary | ICD-10-CM | POA: Insufficient documentation

## 2014-11-23 DIAGNOSIS — L910 Hypertrophic scar: Secondary | ICD-10-CM | POA: Insufficient documentation

## 2014-11-23 DIAGNOSIS — L732 Hidradenitis suppurativa: Secondary | ICD-10-CM | POA: Insufficient documentation

## 2014-11-23 DIAGNOSIS — I519 Heart disease, unspecified: Secondary | ICD-10-CM | POA: Insufficient documentation

## 2014-11-23 MED ORDER — LISINOPRIL-HYDROCHLOROTHIAZIDE 10-12.5 MG PO TABS: 1.0000 | ORAL_TABLET | Freq: Every day | ORAL | Status: DC

## 2014-11-23 NOTE — Patient Instructions (Addendum)
Take OTC Miralax to clear out bowels.

## 2014-11-23 NOTE — Progress Notes (Signed)
Patient ID: Terrance Carter, male   DOB: 16-Dec-1960, 54 y.o.   MRN: [id number]   Terrance Carter  MRN: [id number] DOB: Sep 08, 1960  Subjective:  Dizziness This is a new problem. The current episode started in the past 7 days. The problem occurs intermittently. The problem has been gradually worsening. Associated symptoms include headaches. Pertinent negatives include no chest pain, congestion, coughing, nausea, neck pain, numbness, visual change or vomiting.  Patient reports that his BP has been running high the past few days (around 160s/70s).  Patient reports that he took 3 doses of amlodipine yesterday to help his BP go down. Dizziness usually flares up when he stands quickly. Does not occur when sitting or lying down. Is not triggered by head turning.   He also inquires about getting a bowel cleansing. He states he feels bloated a lot and thinks he needs a good cleansing. No constipation, diarrhea or blood in stool. Has never had a colonoscopy.   Patient Active Problem List   Diagnosis Date Noted  . Cancer of vocal cord 11/23/2014  . Change in vision 11/23/2014  . Chest pain 11/23/2014  . Dysphagia 11/23/2014  . Heart disease 11/23/2014  . Hidradenitis suppurativa 11/23/2014  . Hypertension 11/23/2014  . Keloid 11/23/2014  . 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 Cancer Laryngeal  . Hypertension   . Heart disease   . History of chicken pox   . History of mumps   . History of measles     Social History   Social History  . Marital Status: Single    Spouse Name: N/A  . Number of Children: N/A  . Years of Education: N/A   Occupational History  . Not on file.   Social History Main Topics  . Smoking status: Former Research scientist (life sciences)  . Smokeless tobacco: Never Used  . Alcohol Use: Not on file  . Drug Use: Not on file  . Sexual Activity: Not on file   Other Topics Concern  . Not on file   Social History Narrative    Outpatient Prescriptions  Prior to Visit  Medication Sig Dispense Refill  . amLODipine (NORVASC) 10 MG tablet Take 1 tablet (10 mg total) by mouth daily. 30 tablet 5  . fluticasone (FLONASE) 50 MCG/ACT nasal spray Place 2 sprays into both nostrils daily. 16 g 3  . ramipril (ALTACE) 10 MG capsule Take 1 capsule (10 mg total) by mouth daily. 30 capsule 6   No facility-administered medications prior to visit.    No Known Allergies  Review of Systems  HENT: Negative for congestion.   Respiratory: Negative for cough.   Cardiovascular: Negative for chest pain.  Gastrointestinal: Negative for nausea and vomiting.  Musculoskeletal: Negative for neck pain.  Neurological: Positive for dizziness and headaches. Negative for numbness.   Objective:  BP 164/88 mmHg  Pulse 88  Temp(Src) 97.9 F (36.6 C)  Resp 16  Ht 5\' 7"  (1.702 m)  Wt 202 lb (91.627 kg)  BMI 31.63 kg/m2  Physical Exam  General Appearance:    Alert, cooperative, no distress  Eyes:    PERRL, conjunctiva/corneas clear, EOM's intact       Lungs:     Clear to auscultation bilaterally, respirations unlabored  Heart:    Regular rate and rhythm  Neurologic:   Awake, alert, oriented x 3. No apparent focal neurological           defect.  Assessment and Plan :  1. Colon cancer screening Has never had a colonoscopy and he would like a bowel cleansing.  - Ambulatory referral to Gastroenterology  2. Hypertension, unspecified Suspect BP has been labile causing dizziness. Had CBC and cr checked last month at cancer center which were normal.   Change ramipril to lisinopril-hctz 10.12.5 continue 10mg  amlodipine. Return for BP check in about 3 weeks.  Call if symptoms change or if not rapidly improving.     3. Dizziness Probably secondary to labile BP   Lelon Huh, MD [facility] 11/23/2014 2:21 PM

## 2014-11-23 NOTE — Telephone Encounter (Signed)
Rx refilled when pt was in office today.

## 2014-11-23 NOTE — Telephone Encounter (Signed)
Pt called stating he needs  A refill on the following medication. [facility] on [address]  amLODipine (NORVASC) 10 MG tablet ramipril (ALTACE) 10 MG capsule

## 2014-11-27 ENCOUNTER — Telehealth: Payer: Self-pay | Admitting: Gastroenterology

## 2014-11-27 NOTE — Telephone Encounter (Signed)
Patient returning call for colonoscopy screening. Please call him back at [phone]

## 2014-11-28 NOTE — Telephone Encounter (Signed)
Phoned patient for colon triage, no answer left message to contact office  °

## 2014-12-10 NOTE — Telephone Encounter (Signed)
Mailed letter to patient to contact office.

## 2015-03-25 ENCOUNTER — Ambulatory Visit: Payer: Medicaid Other

## 2015-03-26 ENCOUNTER — Other Ambulatory Visit: Payer: Self-pay | Admitting: *Deleted

## 2015-03-26 ENCOUNTER — Inpatient Hospital Stay: Payer: Medicaid Other

## 2015-03-26 ENCOUNTER — Ambulatory Visit: Admission: RE | Admit: 2015-03-26 | Payer: Medicaid Other | Source: Ambulatory Visit

## 2015-03-26 DIAGNOSIS — C320 Malignant neoplasm of glottis: Secondary | ICD-10-CM

## 2015-04-08 ENCOUNTER — Ambulatory Visit
Admission: RE | Admit: 2015-04-08 | Discharge: 2015-04-08 | Disposition: A | Discharge status: Home / Self Care | Payer: Medicaid Other | Source: Ambulatory Visit | Attending: Internal Medicine | Admitting: Internal Medicine

## 2015-04-08 ENCOUNTER — Inpatient Hospital Stay: Payer: Medicaid Other

## 2015-04-08 DIAGNOSIS — I10 Essential (primary) hypertension: Secondary | ICD-10-CM | POA: Diagnosis not present

## 2015-04-08 DIAGNOSIS — Z87891 Personal history of nicotine dependence: Secondary | ICD-10-CM | POA: Insufficient documentation

## 2015-04-08 DIAGNOSIS — C329 Malignant neoplasm of larynx, unspecified: Secondary | ICD-10-CM

## 2015-04-08 DIAGNOSIS — Z923 Personal history of irradiation: Secondary | ICD-10-CM | POA: Diagnosis not present

## 2015-04-08 DIAGNOSIS — I252 Old myocardial infarction: Secondary | ICD-10-CM | POA: Diagnosis not present

## 2015-04-08 DIAGNOSIS — Z79899 Other long term (current) drug therapy: Secondary | ICD-10-CM | POA: Diagnosis not present

## 2015-04-08 DIAGNOSIS — Z9221 Personal history of antineoplastic chemotherapy: Secondary | ICD-10-CM | POA: Diagnosis not present

## 2015-04-08 DIAGNOSIS — C321 Malignant neoplasm of supraglottis: Secondary | ICD-10-CM | POA: Diagnosis present

## 2015-04-08 DIAGNOSIS — D72819 Decreased white blood cell count, unspecified: Secondary | ICD-10-CM | POA: Diagnosis not present

## 2015-04-08 DIAGNOSIS — C320 Malignant neoplasm of glottis: Secondary | ICD-10-CM

## 2015-04-08 DIAGNOSIS — R4702 Dysphasia: Secondary | ICD-10-CM | POA: Diagnosis not present

## 2015-04-08 DIAGNOSIS — Z8521 Personal history of malignant neoplasm of larynx: Secondary | ICD-10-CM | POA: Insufficient documentation

## 2015-04-08 LAB — COMPREHENSIVE METABOLIC PANEL
ALT: 19 U/L (ref 17–63)
ANION GAP: 9 (ref 5–15)
AST: 24 U/L (ref 15–41)
Albumin: 4.4 g/dL (ref 3.5–5.0)
Alkaline Phosphatase: 60 U/L (ref 38–126)
BILIRUBIN TOTAL: 0.6 mg/dL (ref 0.3–1.2)
BUN: 19 mg/dL (ref 6–20)
CO2: 25 mmol/L (ref 22–32)
Calcium: 9.1 mg/dL (ref 8.9–10.3)
Chloride: 103 mmol/L (ref 101–111)
Creatinine, Ser: 1.27 mg/dL — ABNORMAL HIGH (ref 0.61–1.24)
Glucose, Bld: 108 mg/dL — ABNORMAL HIGH (ref 65–99)
POTASSIUM: 3.9 mmol/L (ref 3.5–5.1)
Sodium: 137 mmol/L (ref 135–145)
TOTAL PROTEIN: 7.6 g/dL (ref 6.5–8.1)

## 2015-04-08 LAB — CBC WITH DIFFERENTIAL/PLATELET
Basophils Absolute: 0 10*3/uL (ref 0–0.1)
Basophils Relative: 1 %
EOS PCT: 1 %
Eosinophils Absolute: 0 10*3/uL (ref 0–0.7)
HEMATOCRIT: 40.6 % (ref 40.0–52.0)
Hemoglobin: 13.8 g/dL (ref 13.0–18.0)
LYMPHS PCT: 15 %
Lymphs Abs: 0.5 10*3/uL — ABNORMAL LOW (ref 1.0–3.6)
MCH: 32.4 pg (ref 26.0–34.0)
MCHC: 33.9 g/dL (ref 32.0–36.0)
MCV: 95.5 fL (ref 80.0–100.0)
MONO ABS: 0.5 10*3/uL (ref 0.2–1.0)
Monocytes Relative: 14 %
Neutro Abs: 2.3 10*3/uL (ref 1.4–6.5)
Neutrophils Relative %: 69 %
PLATELETS: 230 10*3/uL (ref 150–440)
RBC: 4.25 MIL/uL — ABNORMAL LOW (ref 4.40–5.90)
RDW: 13.1 % (ref 11.5–14.5)
WBC: 3.3 10*3/uL — AB (ref 3.8–10.6)

## 2015-04-08 MED ORDER — IOHEXOL 300 MG/ML  SOLN
75.0000 mL | Freq: Once | INTRAMUSCULAR | Status: AC | PRN
  Administered 2015-04-08: 75 mL via INTRAVENOUS

## 2015-04-09 ENCOUNTER — Inpatient Hospital Stay: Payer: Medicaid Other

## 2015-04-10 ENCOUNTER — Encounter: Payer: Self-pay | Admitting: Internal Medicine

## 2015-04-10 ENCOUNTER — Inpatient Hospital Stay: Payer: Medicaid Other | Attending: Internal Medicine | Admitting: Internal Medicine

## 2015-04-10 VITALS — BP 207/128 | HR 90 | Temp 98.3°F | Resp 18 | Ht 67.0 in | Wt 210.8 lb

## 2015-04-10 DIAGNOSIS — Z87891 Personal history of nicotine dependence: Secondary | ICD-10-CM | POA: Diagnosis not present

## 2015-04-10 DIAGNOSIS — D72819 Decreased white blood cell count, unspecified: Secondary | ICD-10-CM | POA: Diagnosis not present

## 2015-04-10 DIAGNOSIS — Z8521 Personal history of malignant neoplasm of larynx: Secondary | ICD-10-CM | POA: Diagnosis not present

## 2015-04-10 DIAGNOSIS — C760 Malignant neoplasm of head, face and neck: Secondary | ICD-10-CM

## 2015-04-10 DIAGNOSIS — I10 Essential (primary) hypertension: Secondary | ICD-10-CM

## 2015-04-10 DIAGNOSIS — R4702 Dysphasia: Secondary | ICD-10-CM | POA: Diagnosis not present

## 2015-04-10 DIAGNOSIS — Z79899 Other long term (current) drug therapy: Secondary | ICD-10-CM

## 2015-04-10 NOTE — Progress Notes (Signed)
[place]  Telephone:(336) [phone] Fax:(336) [phone]     ID: Nyra Capes OB: June 12, 1960  MR#: [id number]  OYD#:[id number]  Patient Care Team: Birdie Sons, MD as PCP - General (Family Medicine) Yolonda Kida, MD as Consulting Physician (Cardiology) Leia Alf, MD as Attending Physician (Internal Medicine)  CHIEF COMPLAINT/DIAGNOSIS:  T3/T4 squamous cell carcinoma of supraglottic area - completed 2 cycles of induction chemotherapy with Cisplatin/Taxotere/continuous IV 5-FU on 02/07/11 with excellent response. Completed radiation along with concurrent weekly cisplatin chemotherapy mid-February 2013.    HISTORY OF PRESENT ILLNESS:  Mr. Terrance Carter is a 55 year old gentleman who returns to our clinic for a follow-up appointment, almost 4 years after completion of treatment for head and neck cancer. He has done reasonably well since his last appointment. He has occasional sensation of food stuck in his throat, but denies cough, shortness of breath. He denies any new masses or lesions in his mouth or neck. He occasionally complains of shooting pain, which originates from the left shoulder and radiates to the base of the skull, usually related to strenuous physical activities.  REVIEW OF SYSTEMS:   ROS As in HPI above. In addition, no fever, chills or sweats. No new headaches or focal weakness.  No new mood disturbances. No  sore throat, cough, shortness of breath, sputum, hemoptysis or chest pain. No dizziness or palpitation. No abdominal pain, constipation, diarrhea, dysuria or hematuria. No new skin rash or bleeding symptoms. No new paresthesias in extremities. PS ECOG 0.  PAST MEDICAL HISTORY: Reviewed. Past Medical History  Diagnosis Date  . Cancer (HCC) Laryngeal  . History of chicken pox   . History of mumps   . History of measles   . MI, old   . Hypertension           Hypertension.   h/o ?NSTEMI .   History of substance abuse   Supraglottic squamous cell  Ca diagnosed Oct 2012 requiring tracheostomy on 12/23/10.  PAST SURGICAL HISTORY: Reviewed. Past Surgical History  Procedure Laterality Date  . Tracheostomy  12/23/10    for SCC of supraglottis. Dr. Tami Ribas    FAMILY HISTORY: Reviewed. Noncontributory. Denies hematological disorders or malignancy.  SOCIAL HISTORY: Reviewed. Social History  Substance Use Topics  . Smoking status: Former Research scientist (life sciences)  . Smokeless tobacco: Never Used  . Alcohol Use: 0.0 oz/week    0 Standard drinks or equivalent per week     Comment: Drinks wine once a month  Has history of smoking. Denies alcohol or current recreational drug usage. Formerly abused marijuana, quit few months ago. He is a Therapist, nutritional by profession.  No Known Allergies  Current Outpatient Prescriptions  Medication Sig Dispense Refill  . amLODipine (NORVASC) 10 MG tablet Take 1 tablet (10 mg total) by mouth daily. 30 tablet 5  . fluticasone (FLONASE) 50 MCG/ACT nasal spray Place 2 sprays into both nostrils daily. 16 g 3  . lisinopril-hydrochlorothiazide (PRINZIDE,ZESTORETIC) 10-12.5 MG per tablet Take 1 tablet by mouth daily. 30 tablet 3   No current facility-administered medications for this visit.    PHYSICAL EXAM: Filed Vitals:   04/10/15 1457  BP: 207/128  Pulse: 90  Temp: 98.3 F (36.8 C)  Resp: 18   Body mass index is 33 kg/(m^2).    ECOG FS:0 - Asymptomatic  GENERAL: Patient is an African-American male, who is alert and oriented and in no acute distress. There is no icterus. HEENT: EOMs intact. Oral exam negative for thrush or lesions. No cervical lymphadenopathy. Status  post tracheostomy CVS: S1S2, regular LUNGS: Bilaterally clear to auscultation, no rhonchi. ABDOMEN: Soft, nontender. No hepatomegaly clinically.  NEURO: grossly nonfocal, cranial nerves are intact.   EXTREMITIES: No pedal edema. Musculoskeletal:  LAB RESULTS: Recent Results (from the past 2160 hour(s))  CBC with Differential/Platelet     Status: Abnormal     Collection Time: 04/08/15  9:25 AM  Result Value Ref Range   WBC 3.3 (L) 3.8 - 10.6 K/uL   RBC 4.25 (L) 4.40 - 5.90 MIL/uL   Hemoglobin 13.8 13.0 - 18.0 g/dL   HCT 40.6 40.0 - 52.0 %   MCV 95.5 80.0 - 100.0 fL   MCH 32.4 26.0 - 34.0 pg   MCHC 33.9 32.0 - 36.0 g/dL   RDW 13.1 11.5 - 14.5 %   Platelets 230 150 - 440 K/uL   Neutrophils Relative % 69 %   Neutro Abs 2.3 1.4 - 6.5 K/uL   Lymphocytes Relative 15 %   Lymphs Abs 0.5 (L) 1.0 - 3.6 K/uL   Monocytes Relative 14 %   Monocytes Absolute 0.5 0.2 - 1.0 K/uL   Eosinophils Relative 1 %   Eosinophils Absolute 0.0 0 - 0.7 K/uL   Basophils Relative 1 %   Basophils Absolute 0.0 0 - 0.1 K/uL  Comprehensive metabolic panel     Status: Abnormal   Collection Time: 04/08/15  9:25 AM  Result Value Ref Range   Sodium 137 135 - 145 mmol/L   Potassium 3.9 3.5 - 5.1 mmol/L   Chloride 103 101 - 111 mmol/L   CO2 25 22 - 32 mmol/L   Glucose, Bld 108 (H) 65 - 99 mg/dL   BUN 19 6 - 20 mg/dL   Creatinine, Ser 1.27 (H) 0.61 - 1.24 mg/dL   Calcium 9.1 8.9 - 10.3 mg/dL   Total Protein 7.6 6.5 - 8.1 g/dL   Albumin 4.4 3.5 - 5.0 g/dL   AST 24 15 - 41 U/L   ALT 19 17 - 63 U/L   Alkaline Phosphatase 60 38 - 126 U/L   Total Bilirubin 0.6 0.3 - 1.2 mg/dL   GFR calc non Af Amer >60 >60 mL/min   GFR calc Af Amer >60 >60 mL/min    Comment: (NOTE) The eGFR has been calculated using the CKD EPI equation. This calculation has not been validated in all clinical situations. eGFR's persistently <60 mL/min signify possible Chronic Kidney Disease.    Anion gap 9 5 - 15    Lab Results  Component Value Date   WBC 3.3* 04/08/2015   NEUTROABS 2.3 04/08/2015   HGB 13.8 04/08/2015   HCT 40.6 04/08/2015   MCV 95.5 04/08/2015   PLT 230 04/08/2015   .last STUDIES: 04/08/2015 EXAM: CT NECK WITH CONTRAST  TECHNIQUE: Multidetector CT imaging of the neck was performed using the standard protocol following the bolus administration of  intravenous contrast.  CONTRAST: 60m OMNIPAQUE IOHEXOL 300 MG/ML SOLN  COMPARISON: 02/19/2014 and earlier.  FINDINGS: Pharynx and larynx: Diffuse pharyngeal mucosal space soft tissue thickening, relatively sparing the nasopharynx, appears stable since 02/19/2014. No discrete pharyngeal or laryngeal hyper enhancement or mass. 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 Stable thyroid cartilage. Negative superior parapharyngeal spaces. Trace chronic retropharyngeal effusion is unchanged.  Salivary glands: Sublingual space, submandibular glands, and parotid glands are within normal limits.  Thyroid: Negative.  Lymph nodes: No cervical lymphadenopathy. Scattered small cervical lymph nodes at all nodal levels appear stable and within normal limits. No cervical lymphadenopathy.  Vascular: Major vascular structures in the neck and at the skullbase are patent.  Limited intracranial: Negative.  Visualized orbits: Negative.  Mastoids and visualized paranasal sinuses: Clear.  Skeleton: Stable visualized osseous structures.  Upper chest: Negative visualized superior mediastinum. Lung apices are stable and negative. No axillary lymphadenopathy.  IMPRESSION: Satisfactory post treatment appearance of the neck. Post radiation changes to the pharyngeal mucosal space.  No residual laryngeal/pharyngeal mass or lymphadenopathy.   Electronically Signed  By: Genevie Ann M.D.  On: 04/08/2015 13:01   I personally reviewed the CT scan images.  ASSESSMENT / PLAN:   1. T3/T4 squamous cell carcinoma of supraglottic area, status post 2 cycles of induction chemotherapy with cisplatin/Taxotere/continuous 5-FU infusion with excellent response to chemotherapy -   Patient appears to be in complete remission. His current complaints are likely of musculoskeletal origin, and unrelated to the previously treated head and neck cancer. He completed treatment more than 3  years ago, as such we should switch to annual imaging for the next 2 years. We should continue to see Mr. Dotson every 6 months in our clinic.  2. Smoking Cessation - patient has quit smoking. 3. Chronic leucopenia and mild neutropenia - asymptomatic, continue to monitor.  4. Dysphasia-appears to be mild, without signs, suspicious for aspiration. I advised him to discuss this issue with his ENT doctors, however, if he decides to seek another opinion, we will refer him to our speech and swallow evaluation. So far he has been able to manage his occasional sensations by drinking water/liquids 5. Hypertension-patient will take his antihypertensive medications after the visit. He is currently asymptomatic, no need for immediate intervention. 6. In between visits he was advised to call in case of any new symptoms or acute sickness and will be evaluated sooner. He is agreeable to this plan.    Roxana Hires, MD   04/10/2015 2:44 PM

## 2015-04-10 NOTE — Progress Notes (Signed)
Pt states he is doing well but notices that he had diccult with bread feels like stuck in throat. He also notates sometimes his  Left shoulder hurts and mkae pain go up neck and into head area. Eating and drinking good, bowels ok. Still having dryness in his mouth. At end of visit rechecked b/p 200/110. Some better.  Pt states that he did not take either of b/p med because he drank a beer and did not want to mix alcohol with pills.  He will take when he gets home and he does not have HA or any chest pain. He does state that he usually only has a beer once a month.  Advised pt that he should take his b/p meds every day as ordered and if he wants alcohol to take it several hours after his medication has been taken. And try not to drink very often.

## 2015-06-01 IMAGING — CT CT NECK WITH CONTRAST
4 of 5 series · 15 of 33 positions shown, 17 images · IV contrast (isovue)
Comparison: 01/24/2013.

CLINICAL DATA: 53-year-old male restaging laryngeal cancer (T3/T4
squamous cell carcinoma of the supraglottic region). Intermittent
hoarseness. Post chemotherapy and radiation therapy. Initial
encounter.

EXAM:
CT NECK WITH CONTRAST
TECHNIQUE: Multidetector CT imaging of the neck was performed using the
standard protocol following the bolus administration of intravenous
contrast.
CONTRAST:  75 cc Isovue 370.

[Series 2: axial neck · axial · 0.55mm/px · z∈[-428,-292]mm · 4 of 114 slices shown, 5 images]
[im 23/114  soft-tissue]
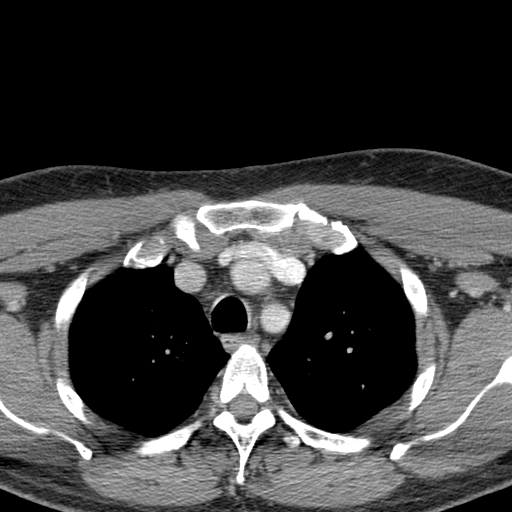
[im 23/114  bone]
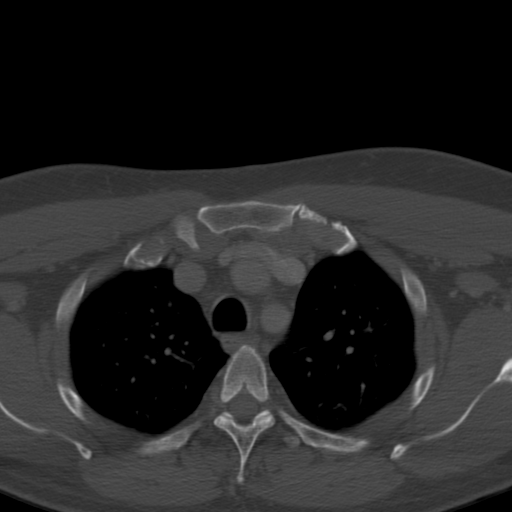
[im 46/114  bone]
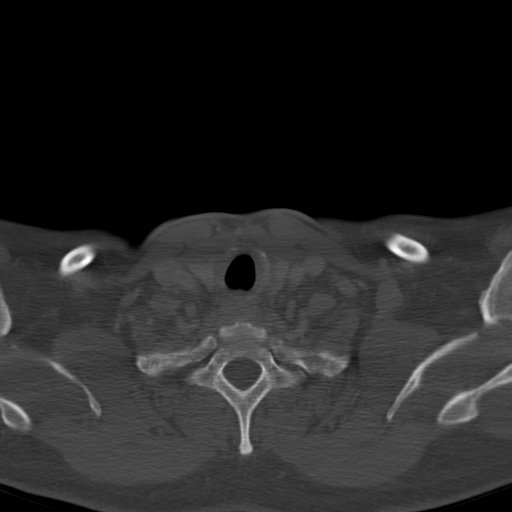
[im 68/114  bone]
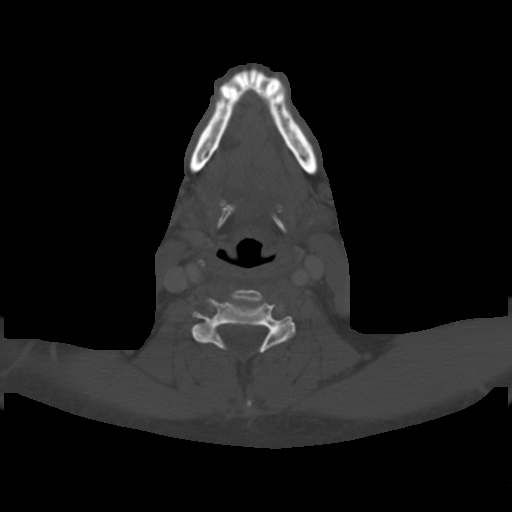
[im 91/114  bone]
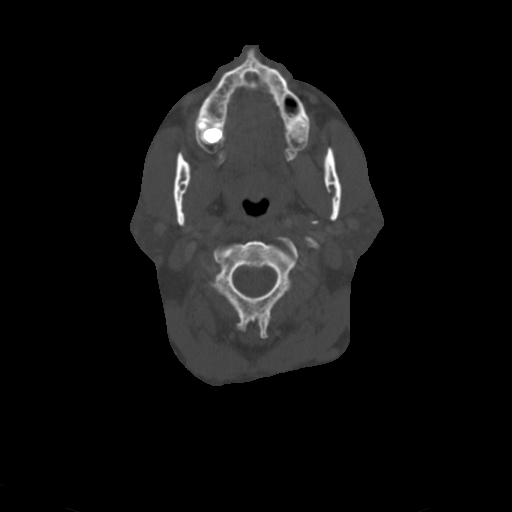

[Series 5: sag neck · sagittal · 0.44mm/px · 5 of 139 slices shown, 6 images]
[im 47/139  bone]
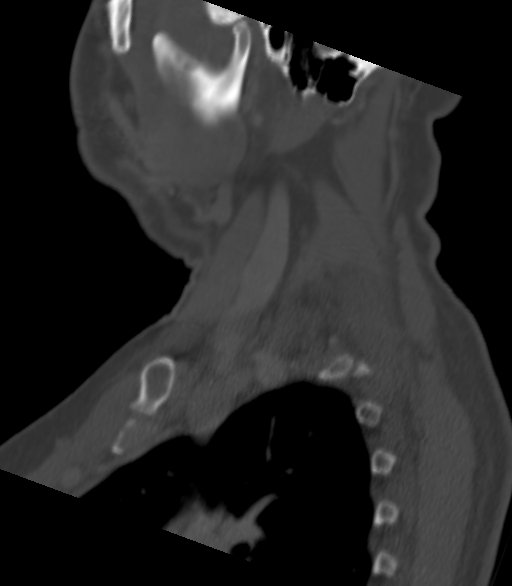
[im 58/139  bone]
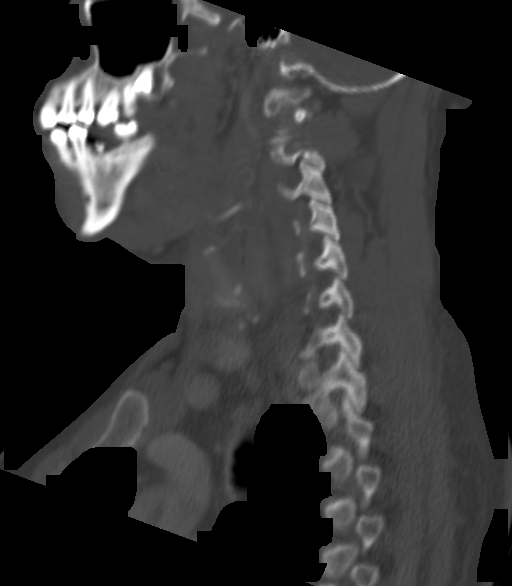
[im 70/139  soft-tissue]
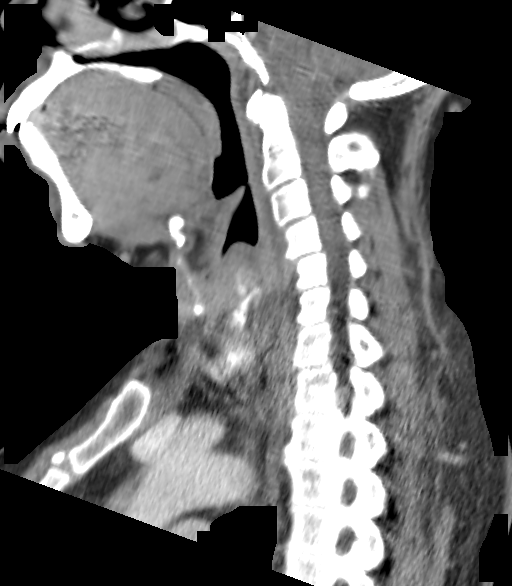
[im 70/139  bone]
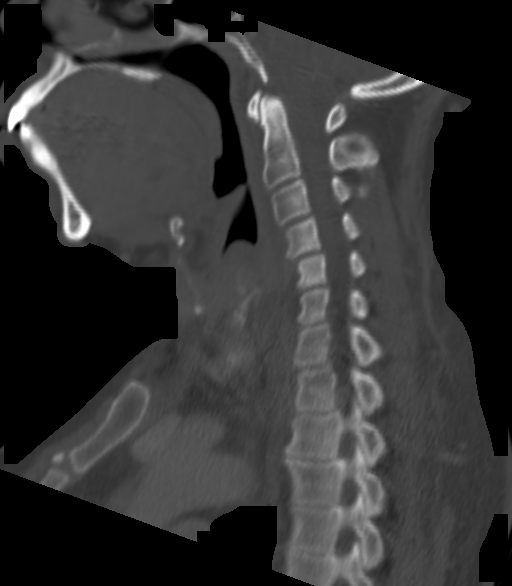
[im 81/139  bone]
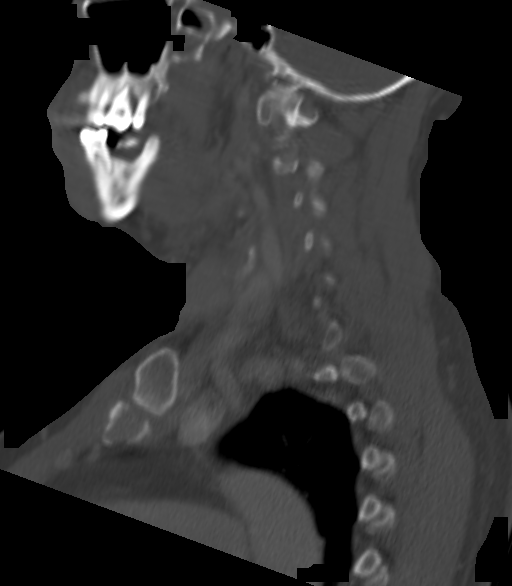
[im 93/139  bone]
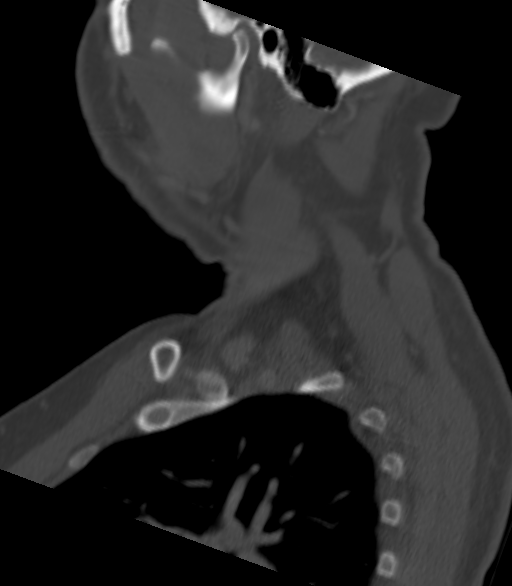

[Series 6: cor neck · coronal · 0.49mm/px · 3 of 123 slices shown]
[im 36/123  bone]
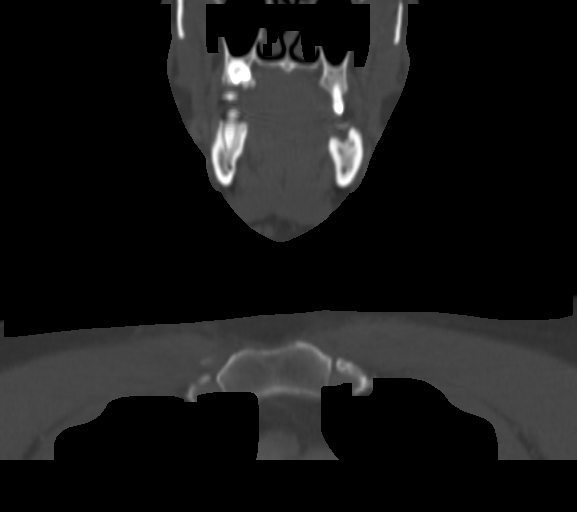
[im 53/123  bone]
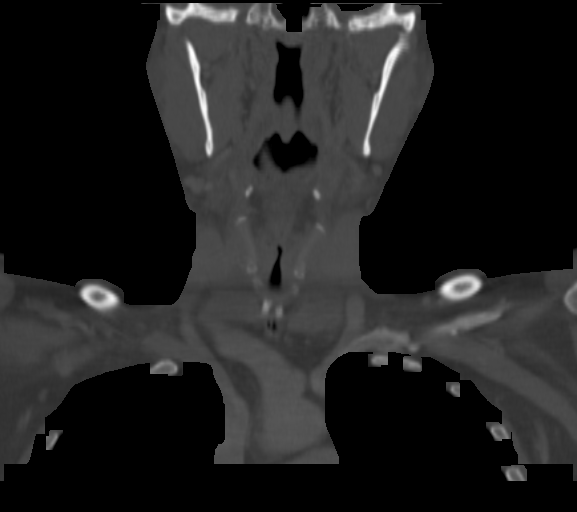
[im 70/123  bone]
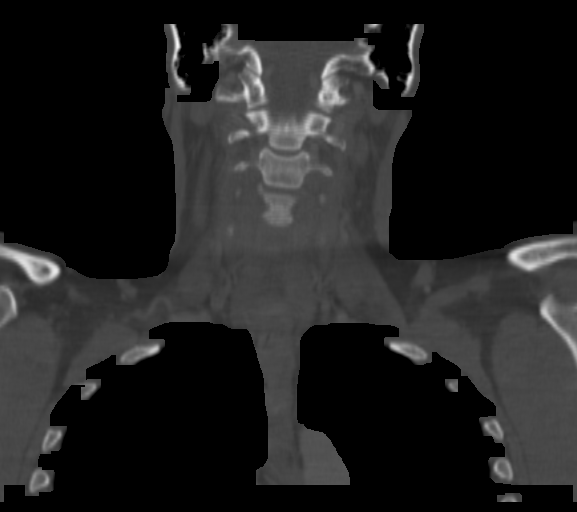

[Series 7: ax oropharynx · axial · 0.46mm/px · z∈[-457,-364]mm · 3 of 121 slices shown]
[im 25/121  bone]
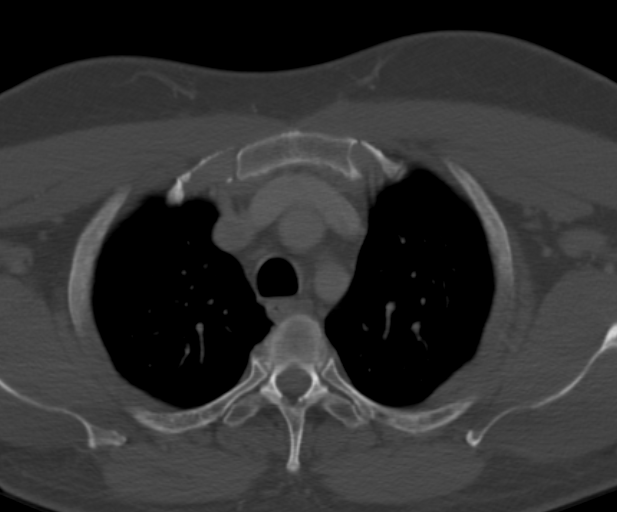
[im 49/121  bone]
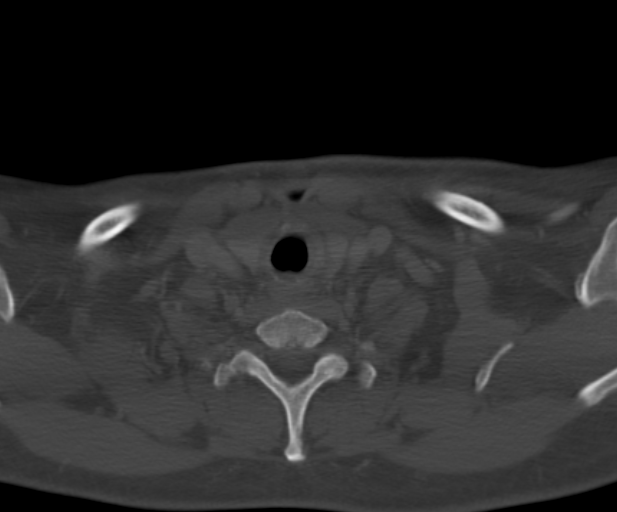
[im 73/121  bone]
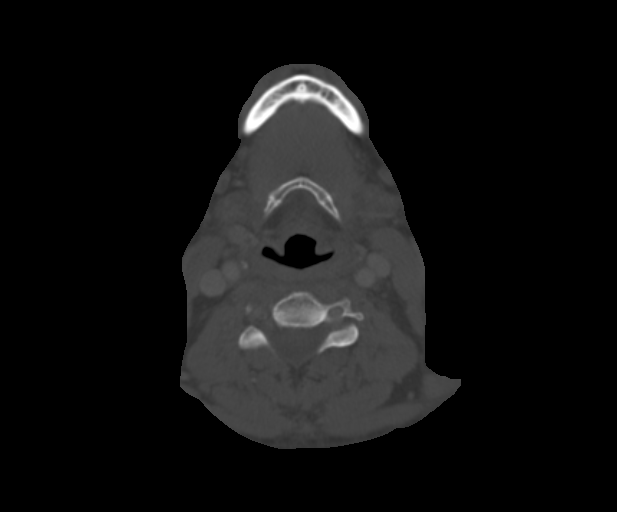

[15 of 33 positions shown; findings below may reference images not displayed]

FINDINGS: Diffuse thickening of the epiglottis and aryepiglottic folds.
Prominent thickening of the glottic and immediate supraglottic
region. Findings have an appearance suggestive of post therapy type
changes relatively similar to the prior post therapy exam. No
surrounding adenopathy.

Significant dental disease lower molar roots bilaterally.

Visualized lung apices are clear.

Mild to slightly moderate mucosal thickening left sphenoid sinus air
cell.
IMPRESSION: Diffuse thickening of the epiglottis and aryepiglottic folds.
Prominent thickening of the glottic and immediate supraglottic
region. Findings have an appearance suggestive of post therapy type
changes relatively similar to the prior post therapy exam. No
surrounding adenopathy.

Significant dental disease lower molar roots bilaterally.

Mild to slightly moderate mucosal thickening left sphenoid sinus air
cell.

## 2015-10-10 ENCOUNTER — Other Ambulatory Visit: Payer: Medicaid Other

## 2015-10-10 ENCOUNTER — Ambulatory Visit: Payer: Medicaid Other | Admitting: Family Medicine

## 2015-10-10 ENCOUNTER — Ambulatory Visit: Payer: Medicaid Other | Admitting: Oncology

## 2015-10-14 ENCOUNTER — Other Ambulatory Visit: Payer: Medicaid Other

## 2015-10-14 ENCOUNTER — Ambulatory Visit: Payer: Medicaid Other | Admitting: Internal Medicine

## 2015-10-15 ENCOUNTER — Other Ambulatory Visit: Payer: Self-pay | Admitting: Family Medicine

## 2015-10-15 DIAGNOSIS — I10 Essential (primary) hypertension: Secondary | ICD-10-CM

## 2015-10-15 MED ORDER — AMLODIPINE BESYLATE 10 MG PO TABS: 10.0000 mg | ORAL_TABLET | Freq: Every day | ORAL | 1 refills | Status: DC

## 2015-10-15 NOTE — Telephone Encounter (Signed)
Pt needs refill   Amlodipine 10 mg and Lisinopril   He uses [facility] [address]  Thanks [employer]

## 2015-10-16 ENCOUNTER — Other Ambulatory Visit: Payer: Self-pay | Admitting: Family Medicine

## 2015-10-16 ENCOUNTER — Other Ambulatory Visit: Payer: Self-pay | Admitting: *Deleted

## 2015-10-16 DIAGNOSIS — I10 Essential (primary) hypertension: Secondary | ICD-10-CM

## 2015-10-16 DIAGNOSIS — R42 Dizziness and giddiness: Secondary | ICD-10-CM

## 2015-10-16 DIAGNOSIS — C320 Malignant neoplasm of glottis: Secondary | ICD-10-CM

## 2015-10-16 MED ORDER — LISINOPRIL-HYDROCHLOROTHIAZIDE 10-12.5 MG PO TABS: 1.0000 | ORAL_TABLET | Freq: Every day | ORAL | 0 refills | Status: DC

## 2015-10-16 NOTE — Telephone Encounter (Signed)
Pt contacted office for refill request on the following medications:  lisinopril-hydrochlorothiazide (PRINZIDE,ZESTORETIC) 10-12.5 MG per tablet   To [facility].  Last written: 11/23/14 Last OV: 11/23/14 Please advise. Thank TNP

## 2015-10-17 ENCOUNTER — Encounter (INDEPENDENT_AMBULATORY_CARE_PROVIDER_SITE_OTHER): Payer: Self-pay

## 2015-10-17 ENCOUNTER — Other Ambulatory Visit: Payer: Self-pay

## 2015-10-17 ENCOUNTER — Ambulatory Visit
Admission: RE | Admit: 2015-10-17 | Discharge: 2015-10-17 | Disposition: A | Discharge status: Home / Self Care | Payer: Medicaid Other | Source: Ambulatory Visit | Attending: Radiation Oncology | Admitting: Radiation Oncology

## 2015-10-17 ENCOUNTER — Other Ambulatory Visit: Payer: Self-pay | Admitting: *Deleted

## 2015-10-17 ENCOUNTER — Inpatient Hospital Stay: Payer: Medicaid Other | Attending: Internal Medicine | Admitting: Internal Medicine

## 2015-10-17 ENCOUNTER — Inpatient Hospital Stay: Payer: Medicaid Other

## 2015-10-17 DIAGNOSIS — I10 Essential (primary) hypertension: Secondary | ICD-10-CM | POA: Insufficient documentation

## 2015-10-17 DIAGNOSIS — Z923 Personal history of irradiation: Secondary | ICD-10-CM | POA: Diagnosis not present

## 2015-10-17 DIAGNOSIS — Z87891 Personal history of nicotine dependence: Secondary | ICD-10-CM | POA: Insufficient documentation

## 2015-10-17 DIAGNOSIS — C321 Malignant neoplasm of supraglottis: Secondary | ICD-10-CM | POA: Diagnosis present

## 2015-10-17 DIAGNOSIS — C320 Malignant neoplasm of glottis: Secondary | ICD-10-CM

## 2015-10-17 DIAGNOSIS — I252 Old myocardial infarction: Secondary | ICD-10-CM | POA: Insufficient documentation

## 2015-10-17 DIAGNOSIS — R5383 Other fatigue: Secondary | ICD-10-CM | POA: Insufficient documentation

## 2015-10-17 DIAGNOSIS — C329 Malignant neoplasm of larynx, unspecified: Secondary | ICD-10-CM

## 2015-10-17 DIAGNOSIS — Z79899 Other long term (current) drug therapy: Secondary | ICD-10-CM | POA: Diagnosis not present

## 2015-10-17 DIAGNOSIS — Z9221 Personal history of antineoplastic chemotherapy: Secondary | ICD-10-CM

## 2015-10-17 DIAGNOSIS — R1310 Dysphagia, unspecified: Secondary | ICD-10-CM | POA: Diagnosis not present

## 2015-10-17 HISTORY — DX: Malignant neoplasm of supraglottis: C32.1

## 2015-10-17 LAB — CBC WITH DIFFERENTIAL/PLATELET
Basophils Absolute: 0 10*3/uL (ref 0–0.1)
Basophils Relative: 1 %
Eosinophils Absolute: 0 10*3/uL (ref 0–0.7)
Eosinophils Relative: 2 %
HCT: 40.3 % (ref 40.0–52.0)
Hemoglobin: 13.9 g/dL (ref 13.0–18.0)
Lymphocytes Relative: 16 %
Lymphs Abs: 0.5 10*3/uL — ABNORMAL LOW (ref 1.0–3.6)
MCH: 33.1 pg (ref 26.0–34.0)
MCHC: 34.4 g/dL (ref 32.0–36.0)
MCV: 96.2 fL (ref 80.0–100.0)
Monocytes Absolute: 0.3 10*3/uL (ref 0.2–1.0)
Monocytes Relative: 12 %
Neutro Abs: 1.9 10*3/uL (ref 1.4–6.5)
Neutrophils Relative %: 69 %
Platelets: 229 10*3/uL (ref 150–440)
RBC: 4.19 MIL/uL — ABNORMAL LOW (ref 4.40–5.90)
RDW: 13.8 % (ref 11.5–14.5)
WBC: 2.8 10*3/uL — ABNORMAL LOW (ref 3.8–10.6)

## 2015-10-17 LAB — COMPREHENSIVE METABOLIC PANEL WITH GFR
ALT: 19 U/L (ref 17–63)
AST: 26 U/L (ref 15–41)
Albumin: 4.4 g/dL (ref 3.5–5.0)
Alkaline Phosphatase: 67 U/L (ref 38–126)
Anion gap: 9 (ref 5–15)
BUN: 20 mg/dL (ref 6–20)
CO2: 23 mmol/L (ref 22–32)
Calcium: 9 mg/dL (ref 8.9–10.3)
Chloride: 106 mmol/L (ref 101–111)
Creatinine, Ser: 1.21 mg/dL (ref 0.61–1.24)
GFR calc Af Amer: >60 mL/min
GFR calc non Af Amer: >60 mL/min
Glucose, Bld: 99 mg/dL (ref 65–99)
Potassium: 3.9 mmol/L (ref 3.5–5.1)
Sodium: 138 mmol/L (ref 135–145)
Total Bilirubin: 0.7 mg/dL (ref 0.3–1.2)
Total Protein: 7.7 g/dL (ref 6.5–8.1)

## 2015-10-17 LAB — TSH: TSH: 4.291 u[IU]/mL (ref 0.350–4.500)

## 2015-10-17 LAB — T4, FREE: Free T4: 0.75 ng/dL (ref 0.61–1.12)

## 2015-10-17 NOTE — Assessment & Plan Note (Addendum)
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 Recommend evaluation with Dr. Tami Ribas.  #  Former smoker- lung cancer screening. Patient interested.  # difficulty swallowing/chronic- it worse recommend evaluation with EGD.   #  Fatigue- check thyroid profile.  # labs/ MD/ in 1 year.

## 2015-10-17 NOTE — Progress Notes (Signed)
Patient states he has problems swallowing solids.  Left side of neck itches a lot. Also states he has a cough.  Frequently gets phlegm in his throat that he has to get out of bed at night to expectorate. Patient's BP elevated today.  197/96 HR 86.  States he has been out of his BP medications. Has been a few days without it.  Did take it this morning.

## 2015-10-17 NOTE — Progress Notes (Signed)
[facility] OFFICE PROGRESS NOTE  Patient Care Team: Birdie Sons, MD as PCP - General (Family Medicine) Yolonda Kida, MD as Consulting Physician (Cardiology) Leia Alf, MD (Inactive) as Attending Physician (Internal Medicine)  No matching staging information was found for the patient.   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 Completed radiation along with concurrent weekly cisplatin chemotherapy mid-February 2013. [Dr.McQueen; Dr.Crystal]     Primary cancer of supraglottis ([place])   10/17/2015 Initial Diagnosis    Primary cancer of supraglottis ([place])       This is my first interaction with the patient as patient's primary oncologist has been [facility].  I reviewed the patient's prior charts/pertinent labs/imaging in detail; findings are summarized above.    INTERVAL HISTORY:  Terrance Carter 55 y.o.  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  Patient admits to chronic mild difficulty swallowing which is not any worse. He denies any pain neck pain. Denies any unusual or symptoms otherwise.  Appetite is good. No weight loss. No tingling and numbness. Complains of fatigue.  REVIEW OF SYSTEMS:  A complete 10 point review of system is done which is negative except mentioned above/history of present illness.   PAST MEDICAL HISTORY :  Past Medical History:  Diagnosis Date  . Cancer (HCC) Laryngeal  . History of chicken pox   . History of measles   . History of mumps   . Hypertension   . MI, old     PAST SURGICAL HISTORY :   Past Surgical History:  Procedure Laterality Date  . 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 Dr. Tami Ribas    FAMILY HISTORY :   Family History  Problem Relation  Age of Onset  . Diabetes Mother     type 2  . Hypertension Mother   . Diabetes Maternal Uncle     SOCIAL HISTORY:   Social History  Substance Use Topics  . Smoking status: Former Research scientist (life sciences)  . Smokeless tobacco: Never Used  . Alcohol use 0.0 oz/week     Comment: Drinks wine once a month    ALLERGIES:  has No Known Allergies.  MEDICATIONS:  Current Outpatient Prescriptions  Medication Sig Dispense Refill  . amLODipine (NORVASC) 10 MG tablet Take 1 tablet (10 mg total) by mouth daily. 90 tablet 1  . fluticasone (FLONASE) 50 MCG/ACT nasal spray Place 2 sprays into both nostrils daily. 16 g 3  . lisinopril-hydrochlorothiazide (PRINZIDE,ZESTORETIC) 10-12.5 MG tablet Take 1 tablet by mouth daily. PATIENT NEEDS TO SCHEDULE OFFICE VISIT FOR FOLLOW UP 30 tablet 0   No current facility-administered medications for this visit.     PHYSICAL EXAMINATION: ECOG PERFORMANCE STATUS: 0 - Asymptomatic  BP (!) 197/96 (BP Location: Left Arm)   Pulse 86   Temp 98.9 F (37.2 C) (Tympanic)   Resp 18   Wt 205 lb 6 oz (93.2 kg)   BMI 32.17 kg/m   Filed Weights   10/17/15 0848  Weight: 205 lb 6 oz (93.2 kg)    GENERAL: Well-nourished well-developed; Alert, no distress and comfortable.  Alone.  EYES: no pallor or icterus OROPHARYNX: no thrush or ulceration; good dentition  NECK: supple, no masses felt LYMPH:  no palpable lymphadenopathy in the cervical, axillary or inguinal regions LUNGS: clear to  auscultation and  No wheeze or crackles HEART/CVS: regular rate & rhythm and no murmurs; No lower extremity edema ABDOMEN:abdomen soft, non-tender and normal bowel sounds Musculoskeletal:no cyanosis of digits and no clubbing  PSYCH: alert & oriented x 3 with fluent speech NEURO: no focal motor/sensory deficits SKIN:  no rashes or significant lesions  LABORATORY DATA:  I have reviewed the data as listed    Component Value Date/Time   NA 138 10/17/2015 0810   NA 132 (L) 02/11/2013 0627   K 3.9  10/17/2015 0810   K 3.7 02/11/2013 0627   CL 106 10/17/2015 0810   CL 100 02/11/2013 0627   CO2 23 10/17/2015 0810   CO2 23 02/11/2013 0627   GLUCOSE 99 10/17/2015 0810   GLUCOSE 163 (H) 02/11/2013 0627   BUN 20 10/17/2015 0810   BUN 24 (H) 02/11/2013 0627   CREATININE 1.21 10/17/2015 0810   CREATININE 1.15 02/21/2014 1210   CALCIUM 9.0 10/17/2015 0810   CALCIUM 8.7 02/21/2014 1210   PROT 7.7 10/17/2015 0810   PROT 7.6 02/21/2014 1210   ALBUMIN 4.4 10/17/2015 0810   ALBUMIN 3.8 02/21/2014 1210   AST 26 10/17/2015 0810   AST 31 02/21/2014 1210   ALT 19 10/17/2015 0810   ALT 29 02/21/2014 1210   ALKPHOS 67 10/17/2015 0810   ALKPHOS 86 02/21/2014 1210   BILITOT 0.7 10/17/2015 0810   BILITOT 0.6 02/21/2014 1210   GFRNONAA >60 10/17/2015 0810   GFRNONAA >60 02/21/2014 1210   GFRNONAA >60 08/14/2013 1100   GFRAA >60 10/17/2015 0810   GFRAA >60 02/21/2014 1210   GFRAA >60 08/14/2013 1100    No results found for: SPEP, UPEP  Lab Results  Component Value Date   WBC 2.8 (L) 10/17/2015   NEUTROABS 1.9 10/17/2015   HGB 13.9 10/17/2015   HCT 40.3 10/17/2015   MCV 96.2 10/17/2015   PLT 229 10/17/2015      Chemistry      Component Value Date/Time   NA 138 10/17/2015 0810   NA 132 (L) 02/11/2013 0627   K 3.9 10/17/2015 0810   K 3.7 02/11/2013 0627   CL 106 10/17/2015 0810   CL 100 02/11/2013 0627   CO2 23 10/17/2015 0810   CO2 23 02/11/2013 0627   BUN 20 10/17/2015 0810   BUN 24 (H) 02/11/2013 0627   CREATININE 1.21 10/17/2015 0810   CREATININE 1.15 02/21/2014 1210      Component Value Date/Time   CALCIUM 9.0 10/17/2015 0810   CALCIUM 8.7 02/21/2014 1210   ALKPHOS 67 10/17/2015 0810   ALKPHOS 86 02/21/2014 1210   AST 26 10/17/2015 0810   AST 31 02/21/2014 1210   ALT 19 10/17/2015 0810   ALT 29 02/21/2014 1210   BILITOT 0.7 10/17/2015 0810   BILITOT 0.6 02/21/2014 1210       RADIOGRAPHIC STUDIES: I have personally reviewed the radiological images as listed  and agreed with the findings in the report. No results found.   ASSESSMENT & PLAN:  Primary cancer of supraglottis ([place]) # Locally advanced glottic cancer status post induction chemotherapy followed by by definitive chemoradiation finished 2012 clinically; NED. Recommend evaluation with Dr. Tami Ribas.  #  Former smoker- lung cancer screening. Patient interested.  # difficulty swallowing/chronic- it worse recommend evaluation with EGD.   #  Fatigue- check thyroid profile.  # labs/ MD/ in 1 year.    Orders Placed This Encounter  Procedures  . TSH    Standing Status:  Future    Number of Occurrences:   1    Standing Expiration Date:   10/16/2016  . T4, free    Standing Status:   Future    Number of Occurrences:   1    Standing Expiration Date:   10/16/2016  . CBC with Differential    Standing Status:   Future    Standing Expiration Date:   10/16/2016  . Comprehensive metabolic panel    Standing Status:   Future    Standing Expiration Date:   10/16/2016   All questions were answered. The patient knows to call the clinic with any problems, questions or concerns.      Cammie Sickle, MD 10/17/2015 8:59 PM

## 2015-10-17 NOTE — Progress Notes (Signed)
Radiation Oncology Follow up Note  Name: Terrance Carter   Date:   10/17/2015 MRN:  [id number] DOB: 1960-08-17    This 55 y.o. male presents to the clinic today for for follow-up for a T3/T4 squamous cell carcinoma supraglottic larynx.  REFERRING PROVIDER: Birdie Sons, MD  HPI: Patient is a 55 year old male now out for half years status post combined modality treatment with cis-platinum and I MRT radiation therapy for a T3/T4 squamous cell carcinoma supraglottic larynx. Seen today in routine follow-up he is doing well specifically denies head and neck pain or dysphagia. Still has a hoarse voice although it is stable and tone in quality. He has not had a chest x-ray which ordered last year and I've sending him back to radiology today for that.. He had a CT scan back in January 2017 showing no evidence of disease  COMPLICATIONS OF TREATMENT: none  FOLLOW UP COMPLIANCE: keeps appointments   PHYSICAL EXAM:  There were no vitals taken for this visit. Oral cavity is clear no oral mucosal lesions are identified indirect mirror examination shows cord approximately well vallecula and base of tongue within normal limits neck is clear without evidence of subject gastric cervical or supraclavicular adenopathy. Well-developed well-nourished patient in NAD. HEENT reveals PERLA, EOMI, discs not visualized.  Oral cavity is clear. No oral mucosal lesions are identified. Neck is clear without evidence of cervical or supraclavicular adenopathy. Lungs are clear to A&P. Cardiac examination is essentially unremarkable with regular rate and rhythm without murmur rub or thrill. Abdomen is benign with no organomegaly or masses noted. Motor sensory and DTR levels are equal and symmetric in the upper and lower extremities. Cranial nerves II through XII are grossly intact. Proprioception is intact. No peripheral adenopathy or edema is identified. No motor or sensory levels are noted. Crude visual fields are within normal  range.  RADIOLOGY RESULTS: CT scan is reviewed and compatible above-stated findings showing no evidence of disease  PLAN: Present time he is now close to 5 years out with no evidence of disease. I am please was overall progress. I am going to discontinue follow-up care. He will arrange to be followed by ENT in the near future I've also sent him to radiology for a chest x-ray. Patient knows to call with any concerns.  I would like to take this opportunity to thank you for allowing me to participate in the care of your patient.Armstead Peaks., MD

## 2015-10-18 ENCOUNTER — Telehealth: Payer: Self-pay | Admitting: *Deleted

## 2015-10-18 NOTE — Telephone Encounter (Signed)
Received referral for low dose lung cancer screening CT scan. Attempted to leave voicemail at phone number listed in EMR for patient to call me back to facilitate scheduling scan, however, number does not have voicemail set up.

## 2015-10-21 NOTE — Telephone Encounter (Signed)
error 

## 2015-11-08 ENCOUNTER — Telehealth: Payer: Self-pay | Admitting: *Deleted

## 2015-11-08 NOTE — Telephone Encounter (Signed)
Received referral for low dose lung cancer screening CT scan. Attempted to leave voicemail left at phone number listed in EMR for patient to call me back to facilitate scheduling scan. However, no voicemail option available. Will mail letter to patient.

## 2015-11-12 ENCOUNTER — Telehealth: Payer: Self-pay | Admitting: *Deleted

## 2015-11-12 NOTE — Telephone Encounter (Signed)
Received referral for low dose CT lung cancer screening. Despite multiple attempts at all contact numbers available, have not been able to arrange for shared decision making visit and CT scan. Letter mailed to patient in final attempt to contact patient. I will be happy to assist in the future if patient so desires. Will forward to referring provider.  

## 2016-04-09 ENCOUNTER — Ambulatory Visit: Payer: Medicaid Other

## 2016-04-20 ENCOUNTER — Inpatient Hospital Stay: Payer: Medicaid Other | Admitting: Internal Medicine

## 2016-04-20 ENCOUNTER — Inpatient Hospital Stay: Payer: Medicaid Other

## 2016-05-28 ENCOUNTER — Other Ambulatory Visit: Payer: Self-pay

## 2016-05-28 ENCOUNTER — Inpatient Hospital Stay
Admission: EM | Admit: 2016-05-28 | Discharge: 2016-05-29 | DRG: 305 | Disposition: A | Discharge status: Home / Self Care | Payer: Medicaid Other | Attending: Internal Medicine | Admitting: Internal Medicine

## 2016-05-28 ENCOUNTER — Emergency Department: Payer: Medicaid Other

## 2016-05-28 ENCOUNTER — Inpatient Hospital Stay (HOSPITAL_BASED_OUTPATIENT_CLINIC_OR_DEPARTMENT_OTHER): Payer: Medicaid Other | Admitting: Internal Medicine

## 2016-05-28 ENCOUNTER — Inpatient Hospital Stay: Payer: Medicaid Other | Attending: Internal Medicine

## 2016-05-28 ENCOUNTER — Encounter: Payer: Self-pay | Admitting: Emergency Medicine

## 2016-05-28 VITALS — BP 221/118 | HR 82 | Temp 97.6°F | Resp 18 | Ht 67.0 in | Wt 205.0 lb

## 2016-05-28 DIAGNOSIS — Z9221 Personal history of antineoplastic chemotherapy: Secondary | ICD-10-CM | POA: Insufficient documentation

## 2016-05-28 DIAGNOSIS — Z923 Personal history of irradiation: Secondary | ICD-10-CM

## 2016-05-28 DIAGNOSIS — Z8521 Personal history of malignant neoplasm of larynx: Secondary | ICD-10-CM

## 2016-05-28 DIAGNOSIS — I248 Other forms of acute ischemic heart disease: Secondary | ICD-10-CM | POA: Diagnosis present

## 2016-05-28 DIAGNOSIS — Z833 Family history of diabetes mellitus: Secondary | ICD-10-CM

## 2016-05-28 DIAGNOSIS — I10 Essential (primary) hypertension: Secondary | ICD-10-CM | POA: Diagnosis present

## 2016-05-28 DIAGNOSIS — C321 Malignant neoplasm of supraglottis: Secondary | ICD-10-CM

## 2016-05-28 DIAGNOSIS — I674 Hypertensive encephalopathy: Secondary | ICD-10-CM | POA: Diagnosis not present

## 2016-05-28 DIAGNOSIS — Z87891 Personal history of nicotine dependence: Secondary | ICD-10-CM | POA: Diagnosis not present

## 2016-05-28 DIAGNOSIS — R51 Headache: Secondary | ICD-10-CM

## 2016-05-28 DIAGNOSIS — I160 Hypertensive urgency: Principal | ICD-10-CM | POA: Diagnosis present

## 2016-05-28 DIAGNOSIS — I252 Old myocardial infarction: Secondary | ICD-10-CM

## 2016-05-28 DIAGNOSIS — Z8249 Family history of ischemic heart disease and other diseases of the circulatory system: Secondary | ICD-10-CM

## 2016-05-28 DIAGNOSIS — Z7982 Long term (current) use of aspirin: Secondary | ICD-10-CM

## 2016-05-28 DIAGNOSIS — R519 Headache, unspecified: Secondary | ICD-10-CM

## 2016-05-28 DIAGNOSIS — Z8619 Personal history of other infectious and parasitic diseases: Secondary | ICD-10-CM | POA: Diagnosis not present

## 2016-05-28 DIAGNOSIS — R42 Dizziness and giddiness: Secondary | ICD-10-CM | POA: Diagnosis not present

## 2016-05-28 DIAGNOSIS — Z9114 Patient's other noncompliance with medication regimen: Secondary | ICD-10-CM | POA: Diagnosis not present

## 2016-05-28 LAB — CBC WITH DIFFERENTIAL/PLATELET
Basophils Absolute: 0 10*3/uL (ref 0–0.1)
Basophils Relative: 1 %
Eosinophils Absolute: 0.1 10*3/uL (ref 0–0.7)
Eosinophils Relative: 3 %
HEMATOCRIT: 38.5 % — AB (ref 40.0–52.0)
Hemoglobin: 13.1 g/dL (ref 13.0–18.0)
LYMPHS ABS: 0.4 10*3/uL — AB (ref 1.0–3.6)
LYMPHS PCT: 13 %
MCH: 32.6 pg (ref 26.0–34.0)
MCHC: 33.9 g/dL (ref 32.0–36.0)
MCV: 96.3 fL (ref 80.0–100.0)
MONO ABS: 0.4 10*3/uL (ref 0.2–1.0)
MONOS PCT: 13 %
NEUTROS ABS: 2.1 10*3/uL (ref 1.4–6.5)
Neutrophils Relative %: 70 %
Platelets: 261 10*3/uL (ref 150–440)
RBC: 4 MIL/uL — ABNORMAL LOW (ref 4.40–5.90)
RDW: 13.7 % (ref 11.5–14.5)
WBC: 2.9 10*3/uL — ABNORMAL LOW (ref 3.8–10.6)

## 2016-05-28 LAB — CBC
HEMATOCRIT: 40 % (ref 40.0–52.0)
Hemoglobin: 13.6 g/dL (ref 13.0–18.0)
MCH: 33.2 pg (ref 26.0–34.0)
MCHC: 34.1 g/dL (ref 32.0–36.0)
MCV: 97.4 fL (ref 80.0–100.0)
PLATELETS: 246 10*3/uL (ref 150–440)
RBC: 4.11 MIL/uL — ABNORMAL LOW (ref 4.40–5.90)
RDW: 13.9 % (ref 11.5–14.5)
WBC: 3.1 10*3/uL — ABNORMAL LOW (ref 3.8–10.6)

## 2016-05-28 LAB — BASIC METABOLIC PANEL
Anion gap: 4 — ABNORMAL LOW (ref 5–15)
BUN: 23 mg/dL — AB (ref 6–20)
CO2: 28 mmol/L (ref 22–32)
CREATININE: 1.11 mg/dL (ref 0.61–1.24)
Calcium: 9.2 mg/dL (ref 8.9–10.3)
Chloride: 107 mmol/L (ref 101–111)
GFR calc Af Amer: >60 mL/min (ref >60)
Glucose, Bld: 108 mg/dL — ABNORMAL HIGH (ref 65–99)
POTASSIUM: 4.1 mmol/L (ref 3.5–5.1)
Sodium: 139 mmol/L (ref 135–145)

## 2016-05-28 LAB — COMPREHENSIVE METABOLIC PANEL
ALT: 15 U/L — ABNORMAL LOW (ref 17–63)
ANION GAP: 4 — AB (ref 5–15)
AST: 23 U/L (ref 15–41)
Albumin: 4.2 g/dL (ref 3.5–5.0)
Alkaline Phosphatase: 61 U/L (ref 38–126)
BILIRUBIN TOTAL: 0.4 mg/dL (ref 0.3–1.2)
BUN: 23 mg/dL — AB (ref 6–20)
CO2: 26 mmol/L (ref 22–32)
Calcium: 8.9 mg/dL (ref 8.9–10.3)
Chloride: 108 mmol/L (ref 101–111)
Creatinine, Ser: 1.13 mg/dL (ref 0.61–1.24)
Glucose, Bld: 111 mg/dL — ABNORMAL HIGH (ref 65–99)
POTASSIUM: 4.1 mmol/L (ref 3.5–5.1)
Sodium: 138 mmol/L (ref 135–145)
TOTAL PROTEIN: 7.2 g/dL (ref 6.5–8.1)

## 2016-05-28 LAB — TROPONIN I
TROPONIN I: 0.03 ng/mL — AB (ref <0.03)
Troponin I: 0.03 ng/mL (ref <0.03)
Troponin I: 0.03 ng/mL (ref <0.03)

## 2016-05-28 LAB — GLUCOSE, CAPILLARY: GLUCOSE-CAPILLARY: 108 mg/dL — AB (ref 65–99)

## 2016-05-28 MED ORDER — AMLODIPINE BESYLATE 5 MG PO TABS
10.0000 mg | ORAL_TABLET | Freq: Once | ORAL | Status: AC
  Administered 2016-05-28: 10 mg via ORAL
  Filled 2016-05-28: qty 2

## 2016-05-28 MED ORDER — RAMIPRIL 5 MG PO CAPS
10.0000 mg | ORAL_CAPSULE | Freq: Every day | ORAL | Status: DC
  Administered 2016-05-28 – 2016-05-29 (×2): 10 mg via ORAL
  Filled 2016-05-28: qty 2
  Filled 2016-05-28: qty 1

## 2016-05-28 MED ORDER — ASPIRIN EC 81 MG PO TBEC
81.0000 mg | DELAYED_RELEASE_TABLET | Freq: Every day | ORAL | Status: DC
  Administered 2016-05-29: 81 mg via ORAL
  Filled 2016-05-28: qty 1

## 2016-05-28 MED ORDER — ACETAMINOPHEN 650 MG RE SUPP: 650.0000 mg | Freq: Four times a day (QID) | RECTAL | Status: DC | PRN

## 2016-05-28 MED ORDER — HYDRALAZINE HCL 20 MG/ML IJ SOLN
5.0000 mg | Freq: Once | INTRAMUSCULAR | Status: AC
  Administered 2016-05-28: 5 mg via INTRAVENOUS
  Filled 2016-05-28: qty 1

## 2016-05-28 MED ORDER — FLUTICASONE PROPIONATE 50 MCG/ACT NA SUSP
2.0000 | Freq: Every day | NASAL | Status: DC | PRN
  Filled 2016-05-28: qty 16

## 2016-05-28 MED ORDER — LISINOPRIL-HYDROCHLOROTHIAZIDE 10-12.5 MG PO TABS: 2.0000 | ORAL_TABLET | Freq: Every day | ORAL | Status: DC

## 2016-05-28 MED ORDER — HYDROCHLOROTHIAZIDE 12.5 MG PO CAPS
12.5000 mg | ORAL_CAPSULE | Freq: Every day | ORAL | Status: DC
  Administered 2016-05-28: 12.5 mg via ORAL
  Filled 2016-05-28 (×2): qty 1

## 2016-05-28 MED ORDER — LISINOPRIL 10 MG PO TABS
10.0000 mg | ORAL_TABLET | Freq: Every day | ORAL | Status: DC
  Administered 2016-05-28 – 2016-05-29 (×2): 10 mg via ORAL
  Filled 2016-05-28 (×2): qty 1

## 2016-05-28 MED ORDER — ONDANSETRON HCL 4 MG/2ML IJ SOLN: 4.0000 mg | Freq: Four times a day (QID) | INTRAMUSCULAR | Status: DC | PRN

## 2016-05-28 MED ORDER — ACETAMINOPHEN 325 MG PO TABS: 650.0000 mg | ORAL_TABLET | Freq: Four times a day (QID) | ORAL | Status: DC | PRN

## 2016-05-28 MED ORDER — AMLODIPINE BESYLATE 10 MG PO TABS
10.0000 mg | ORAL_TABLET | Freq: Every day | ORAL | Status: DC
  Administered 2016-05-29: 10 mg via ORAL
  Filled 2016-05-28: qty 1

## 2016-05-28 MED ORDER — HYDRALAZINE HCL 20 MG/ML IJ SOLN: 10.0000 mg | Freq: Four times a day (QID) | INTRAMUSCULAR | Status: DC | PRN

## 2016-05-28 MED ORDER — SODIUM CHLORIDE 0.9% FLUSH
3.0000 mL | Freq: Two times a day (BID) | INTRAVENOUS | Status: DC
  Administered 2016-05-28 – 2016-05-29 (×2): 3 mL via INTRAVENOUS

## 2016-05-28 MED ORDER — LABETALOL HCL 5 MG/ML IV SOLN
20.0000 mg | Freq: Once | INTRAVENOUS | Status: AC
  Administered 2016-05-28: 20 mg via INTRAVENOUS
  Filled 2016-05-28: qty 4

## 2016-05-28 MED ORDER — LABETALOL HCL 5 MG/ML IV SOLN
10.0000 mg | Freq: Once | INTRAVENOUS | Status: AC
  Administered 2016-05-28: 10 mg via INTRAVENOUS
  Filled 2016-05-28: qty 4

## 2016-05-28 MED ORDER — CLONIDINE HCL 0.1 MG PO TABS
0.1000 mg | ORAL_TABLET | Freq: Two times a day (BID) | ORAL | Status: DC
  Administered 2016-05-28: 0.1 mg via ORAL
  Filled 2016-05-28: qty 1

## 2016-05-28 MED ORDER — ENOXAPARIN SODIUM 40 MG/0.4ML ~~LOC~~ SOLN
40.0000 mg | SUBCUTANEOUS | Status: DC
  Administered 2016-05-28: 40 mg via SUBCUTANEOUS
  Filled 2016-05-28: qty 0.4

## 2016-05-28 MED ORDER — HYDROCHLOROTHIAZIDE 25 MG PO TABS
25.0000 mg | ORAL_TABLET | Freq: Every day | ORAL | Status: DC
  Administered 2016-05-28: 25 mg via ORAL
  Filled 2016-05-28 (×3): qty 1

## 2016-05-28 MED ORDER — ONDANSETRON HCL 4 MG PO TABS: 4.0000 mg | ORAL_TABLET | Freq: Four times a day (QID) | ORAL | Status: DC | PRN

## 2016-05-28 NOTE — ED Triage Notes (Signed)
Pt sent over from [facility]. Pt BP was 221/100. Pt reports he took his blood pressure medications this morning. Pt reports headache.

## 2016-05-28 NOTE — H&P (Signed)
[facility] NAME: Terrance Carter    MR#:  [id number]  DATE OF BIRTH:  1960-10-05  DATE OF ADMISSION:  05/28/2016  PRIMARY CARE PHYSICIAN: Lelon Huh, MD   REQUESTING/REFERRING PHYSICIAN: Dr. Brenton Grills  CHIEF COMPLAINT:   Chief Complaint  Patient presents with  . Hypertension    HISTORY OF PRESENT ILLNESS:  [name]  is a 56 y.o. male with a known history of Essential hypertension, previous MI, history of laryngeal cancer who presents to the hospital due to elevated blood pressure and dizziness. Patient says he went for his routine follow-up at the cancer center today and noted his systolic blood pressures to be greater than 200, he was also complaining of some dizziness and therefore sent to the ER for further evaluation. In the ER patient was noted to have accelerated hypertension and given multiple doses of IV labetalol but his systolic blood pressures still continue to be higher than 190. Hospitalist services were contacted further treatment and evaluation. Patient denies any chest pain, shortness of breath, nausea, vomiting, abdominal pain, fever chills or any other associated symptoms presently.  PAST MEDICAL HISTORY:   Past Medical History:  Diagnosis Date  . Cancer (HCC) Laryngeal  . History of chicken pox   . History of measles   . History of mumps   . Hypertension   . MI, old     PAST SURGICAL HISTORY:   Past Surgical History:  Procedure Laterality Date  . 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 Dr. Tami Ribas    SOCIAL HISTORY:   Social History  Substance Use Topics  . Smoking status: Former Smoker    Packs/day: 1.00    Years: 20.00  . Smokeless tobacco: Never Used  . Alcohol use 0.0 oz/week     Comment: Drinks wine once a month    FAMILY HISTORY:   Family History  Problem Relation Age of Onset  . Diabetes Mother     type 2  . Hypertension Mother   . Diabetes Maternal Uncle      DRUG ALLERGIES:  No Known Allergies  REVIEW OF SYSTEMS:   Review of Systems  Constitutional: Negative for fever and weight loss.  HENT: Negative for congestion, nosebleeds and tinnitus.   Eyes: Negative for blurred vision, double vision and redness.  Respiratory: Negative for cough, hemoptysis and shortness of breath.   Cardiovascular: Negative for chest pain, orthopnea, leg swelling and PND.  Gastrointestinal: Negative for abdominal pain, diarrhea, melena, nausea and vomiting.  Genitourinary: Negative for dysuria, hematuria and urgency.  Musculoskeletal: Negative for falls and joint pain.  Neurological: Positive for dizziness. Negative for tingling, sensory change, focal weakness, seizures, weakness and headaches.  Endo/Heme/Allergies: Negative for polydipsia. Does not bruise/bleed easily.  Psychiatric/Behavioral: Negative for depression and memory loss. The patient is not nervous/anxious.     MEDICATIONS AT HOME:   Prior to Admission medications   Medication Sig Start Date End Date Taking? Authorizing Provider  amLODipine (NORVASC) 10 MG tablet Take 1 tablet (10 mg total) by mouth daily. 10/15/15  Yes Birdie Sons, MD  aspirin EC 81 MG tablet Take 81 mg by mouth daily.   Yes Historical Provider, MD  fluticasone (FLONASE) 50 MCG/ACT nasal spray Place 2 sprays into both nostrils daily. 09/24/14  Yes Birdie Sons, MD  lisinopril-hydrochlorothiazide (PRINZIDE,ZESTORETIC) 10-12.5 MG tablet Take 1 tablet by mouth daily. PATIENT NEEDS TO SCHEDULE OFFICE VISIT FOR FOLLOW UP 10/16/15  Yes  Birdie Sons, MD      VITAL SIGNS:  Blood pressure (!) 189/97, pulse 74, temperature 98.6 F (37 C), temperature source Oral, resp. rate 13, height 5\' 7"  (1.702 m), weight 93 kg (205 lb), SpO2 98 %.  PHYSICAL EXAMINATION:  Physical Exam  GENERAL:  56 y.o.-year-old patient lying in bed in no acute distress.  EYES: Pupils equal, round, reactive to light and accommodation. No scleral icterus.  Extraocular muscles intact.  HEENT: Head atraumatic, normocephalic. Oropharynx and nasopharynx clear. No oropharyngeal erythema, moist oral mucosa  NECK:  Supple, no jugular venous distention. No thyroid enlargement, no tenderness.  LUNGS: Normal breath sounds bilaterally, no wheezing, rales, rhonchi. No use of accessory muscles of respiration.  CARDIOVASCULAR: S1, S2 RRR. No murmurs, rubs, gallops, clicks.  ABDOMEN: Soft, nontender, nondistended. Bowel sounds present. No organomegaly or mass.  EXTREMITIES: No pedal edema, cyanosis, or clubbing. + 2 pedal & radial pulses b/l.   NEUROLOGIC: Cranial nerves II through XII are intact. No focal Motor or sensory deficits appreciated b/l PSYCHIATRIC: The patient is alert and oriented x 3. SKIN: No obvious rash, lesion, or ulcer.   LABORATORY PANEL:   CBC  Recent Labs Lab 05/28/16 1132  WBC 3.1*  HGB 13.6  HCT 40.0  PLT 246   ------------------------------------------------------------------------------------------------------------------  Chemistries   Recent Labs Lab 05/28/16 1000 05/28/16 1132  NA 138 139  K 4.1 4.1  CL 108 107  CO2 26 28  GLUCOSE 111* 108*  BUN 23* 23*  CREATININE 1.13 1.11  CALCIUM 8.9 9.2  AST 23  --   ALT 15*  --   ALKPHOS 61  --   BILITOT 0.4  --    ------------------------------------------------------------------------------------------------------------------  Cardiac Enzymes  Recent Labs Lab 05/28/16 1132  TROPONINI 0.03*   ------------------------------------------------------------------------------------------------------------------  RADIOLOGY:  Ct Head Wo Contrast  Result Date: 05/28/2016 CLINICAL DATA:  56 year old male with elevated blood pressure and headache. History of laryngeal cancer. EXAM: CT HEAD WITHOUT CONTRAST TECHNIQUE: Contiguous axial images were obtained from the base of the skull through the vertex without intravenous contrast. COMPARISON:  None. FINDINGS: Brain:  Patchy and confluent areas of decreased attenuation are noted throughout the deep and periventricular white matter of the cerebral hemispheres bilaterally, compatible with chronic microvascular ischemic disease. No evidence of acute infarction, hemorrhage, hydrocephalus, extra-axial collection or mass lesion/mass effect. Vascular: No hyperdense vessel or unexpected calcification. Skull: Normal. Negative for fracture or focal lesion. Sinuses/Orbits: No acute finding. Other: None. IMPRESSION: 1. No acute intracranial abnormality. 2. Mild chronic microvascular ischemic changes in the cerebral white matter, as above. Electronically Signed   By: Vinnie Langton M.D.   On: 05/28/2016 12:32     IMPRESSION AND PLAN:   56 year old male with past medical history of hypertension, history of laryngeal cancer who presents to the hospital due to dizziness and headache and noted to have accelerated hypertension.  1. Hypertensive urgency-secondary to medical noncompliance. Patient says that he has not taken his antihypertensives on a regular basis. -I will resume his lisinopril/HCTZ but I will double the dose, continue his Norvasc. I will add some clonidine, place him on as needed hydralazine. Follow hemodynamics. -CT head is negative for any acute pathology. Next  2. Elevated troponin-secondary to supply demand ischemia from accelerated hypertension. -Observe on telemetry, cycle his cardiac markers.  2. History of laryngeal cancer-status post radiation and completion of treatment. -Follows up with the [facility] with Dr. Rogue Bussing.      All the records are reviewed and case  discussed with ED provider. Management plans discussed with the patient, family and they are in agreement.  CODE STATUS: Full code  TOTAL TIME TAKING CARE OF THIS PATIENT: 45 minutes.    Henreitta Leber M.D on 05/28/2016 at 1:20 PM  Between 7am to 6pm - Pager - [phone]  After 6pm go to [email or web address] - password [email or web address]  [facility]  Office  [phone]  CC: Primary care physician; Lelon Huh, MD

## 2016-05-28 NOTE — Plan of Care (Signed)
Problem: Education: Goal: Knowledge of [facility] Education information/materials will improve Outcome: Progressing Patient understands the basic information for [facility], but patient needs to be further educated about medication.   Problem: Health Behavior/Discharge Planning: Goal: Ability to manage health-related needs will improve Outcome: Progressing Patient is improving.   Problem: Skin Integrity: Goal: Risk for impaired skin integrity will decrease Outcome: Progressing Skin is perfect.  Comments: Patient is alert and oriented. Patient is up ad lib. Patient came in with Hypertension due to not taking medication regularly. Patient is on new medication. Patient will be observed.

## 2016-05-28 NOTE — Progress Notes (Signed)
[facility] OFFICE PROGRESS NOTE  Patient Care Team: Birdie Sons, MD as PCP - General (Family Medicine) Yolonda Kida, MD as Consulting Physician (Cardiology) Leia Alf, MD (Inactive) as Attending Physician (Internal Medicine)  Cancer Staging No matching staging information was found for the patient.   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 Completed radiation along with concurrent weekly cisplatin chemotherapy mid-February 2013. [Dr.McQueen; Dr.Crystal]     Primary cancer of supraglottis ([place])   10/17/2015 Initial Diagnosis    Primary cancer of supraglottis ([place])         INTERVAL HISTORY:  Terrance Carter 56 y.o.  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  Patient complains of intermittent dizziness. He also complains of headaches. Patient admits to chronic mild difficulty swallowing which is not any worse. He denies any pain neck pain. Denies any unusual or symptoms otherwise. Denies any new lumps or bumps.  Appetite is good. No weight loss. No tingling and numbness. Complains of fatigue.  REVIEW OF SYSTEMS:  A complete 10 point review of system is done which is negative except mentioned above/history of present illness.   PAST MEDICAL HISTORY :  Past Medical History:  Diagnosis Date  . Cancer (HCC) Laryngeal  . History of chicken pox   . History of measles   . History of mumps   . Hypertension   . MI, old     PAST SURGICAL HISTORY :   Past Surgical History:  Procedure Laterality Date  . 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 Dr. Tami Ribas    FAMILY HISTORY :   Family History  Problem Relation Age of Onset  . Diabetes Mother     type 2  . Hypertension Mother   .  Diabetes Maternal Uncle     SOCIAL HISTORY:   Social History  Substance Use Topics  . Smoking status: Former Research scientist (life sciences)  . Smokeless tobacco: Never Used  . Alcohol use 0.0 oz/week     Comment: Drinks wine once a month    ALLERGIES:  has No Known Allergies.  MEDICATIONS:  Current Outpatient Prescriptions  Medication Sig Dispense Refill  . amLODipine (NORVASC) 10 MG tablet Take 1 tablet (10 mg total) by mouth Carter. 90 tablet 1  . fluticasone (FLONASE) 50 MCG/ACT nasal spray Place 2 sprays into both nostrils Carter. 16 g 3  . lisinopril-hydrochlorothiazide (PRINZIDE,ZESTORETIC) 10-12.5 MG tablet Take 1 tablet by mouth Carter. PATIENT NEEDS TO SCHEDULE OFFICE VISIT FOR FOLLOW UP 30 tablet 0   No current facility-administered medications for this visit.     PHYSICAL EXAMINATION: ECOG PERFORMANCE STATUS: 0 - Asymptomatic  There were no vitals taken for this visit.  There were no vitals filed for this visit.  GENERAL: Well-nourished well-developed; Alert, no distress and comfortable.  Alone.  EYES: no pallor or icterus OROPHARYNX: no thrush or ulceration; good dentition  NECK: supple, no masses felt LYMPH:  no palpable lymphadenopathy in the cervical, axillary or inguinal regions LUNGS: clear to auscultation and  No wheeze or crackles HEART/CVS: regular rate & rhythm and no murmurs; No lower extremity edema ABDOMEN:abdomen soft, non-tender and normal bowel sounds Musculoskeletal:no cyanosis of digits and no clubbing  PSYCH: alert & oriented x 3 with fluent speech NEURO: no focal motor/sensory deficits SKIN:  no rashes or significant lesions  LABORATORY DATA:  I have reviewed the data as listed    Component Value Date/Time   NA 138 05/28/2016 1000   NA 132 (L) 02/11/2013 0627   K 4.1 05/28/2016 1000   K 3.7 02/11/2013 0627   CL 108 05/28/2016 1000   CL 100 02/11/2013 0627   CO2 26 05/28/2016 1000   CO2 23 02/11/2013 0627   GLUCOSE 111 (H) 05/28/2016 1000   GLUCOSE 163 (H)  02/11/2013 0627   BUN 23 (H) 05/28/2016 1000   BUN 24 (H) 02/11/2013 0627   CREATININE 1.13 05/28/2016 1000   CREATININE 1.15 02/21/2014 1210   CALCIUM 8.9 05/28/2016 1000   CALCIUM 8.7 02/21/2014 1210   PROT 7.2 05/28/2016 1000   PROT 7.6 02/21/2014 1210   ALBUMIN 4.2 05/28/2016 1000   ALBUMIN 3.8 02/21/2014 1210   AST 23 05/28/2016 1000   AST 31 02/21/2014 1210   ALT 15 (L) 05/28/2016 1000   ALT 29 02/21/2014 1210   ALKPHOS 61 05/28/2016 1000   ALKPHOS 86 02/21/2014 1210   BILITOT 0.4 05/28/2016 1000   BILITOT 0.6 02/21/2014 1210   GFRNONAA >60 05/28/2016 1000   GFRNONAA >60 02/21/2014 1210   GFRNONAA >60 08/14/2013 1100   GFRAA >60 05/28/2016 1000   GFRAA >60 02/21/2014 1210   GFRAA >60 08/14/2013 1100    No results found for: SPEP, UPEP  Lab Results  Component Value Date   WBC 2.9 (L) 05/28/2016   NEUTROABS 2.1 05/28/2016   HGB 13.1 05/28/2016   HCT 38.5 (L) 05/28/2016   MCV 96.3 05/28/2016   PLT 261 05/28/2016      Chemistry      Component Value Date/Time   NA 138 05/28/2016 1000   NA 132 (L) 02/11/2013 0627   K 4.1 05/28/2016 1000   K 3.7 02/11/2013 0627   CL 108 05/28/2016 1000   CL 100 02/11/2013 0627   CO2 26 05/28/2016 1000   CO2 23 02/11/2013 0627   BUN 23 (H) 05/28/2016 1000   BUN 24 (H) 02/11/2013 0627   CREATININE 1.13 05/28/2016 1000   CREATININE 1.15 02/21/2014 1210      Component Value Date/Time   CALCIUM 8.9 05/28/2016 1000   CALCIUM 8.7 02/21/2014 1210   ALKPHOS 61 05/28/2016 1000   ALKPHOS 86 02/21/2014 1210   AST 23 05/28/2016 1000   AST 31 02/21/2014 1210   ALT 15 (L) 05/28/2016 1000   ALT 29 02/21/2014 1210   BILITOT 0.4 05/28/2016 1000   BILITOT 0.6 02/21/2014 1210       RADIOGRAPHIC STUDIES: I have personally reviewed the radiological images as listed and agreed with the findings in the report. No results found.   ASSESSMENT & PLAN:  No problem-specific Assessment & Plan notes found for this encounter.   No orders  of the defined types were placed in this encounter.  All questions were answered. The patient knows to call the clinic with any problems, questions or concerns.      Cammie Sickle, MD 05/28/2016 10:33 AM

## 2016-05-28 NOTE — Progress Notes (Signed)
Patient escorted by volunteer in cancer center in w/c to ED. Call report to Carleene Overlie, [place]. Pt hypertensive in clinic today- per Dr. Rogue Bussing- most likely needs IV hydralyzine.

## 2016-05-28 NOTE — Assessment & Plan Note (Addendum)
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 Recommend evaluation with Dr. McQueen.  # Elevated Blood pressure- out of refills; 222/118; dizzy/ slight headaches- recommend evaluation in ER asap.   #  Former smoker- lung cancer screening; had issues with transportation. Patient interested this time; wants to talk to Shawn [facility];   # difficulty swallowing/chronic- it worse recommend evaluation with EGD.   # labs/ MD/ in 1 year. Recommend follow up with PCP re: elevated blood pressure.  

## 2016-05-28 NOTE — ED Provider Notes (Signed)
[facility] Emergency Department Provider Note  ____________________________________________  Time seen: Approximately 12:29 PM  I have reviewed the triage vital signs and the nursing notes.   HISTORY  Chief Complaint Headache and dizziness   HPI Terrance Carter is a 56 y.o. male with a history of hypertension who reports bilateral frontal headache which is gradual onset and worsening since last night. It is severe. He also has blurry vision and dizziness. He also is having some memory difficulty. No falls or syncope. Denies chest pain or shortness of breath.  Reports that he is on 2 blood pressure medicines that he cannot remember the names of. He doesn't take them every day but only when he thinks his blood pressure is elevated. He tried taking 1 today without relief of his symptoms. He does not recall which.     Past Medical History:  Diagnosis Date  . Cancer (HCC) Laryngeal  . History of chicken pox   . History of measles   . History of mumps   . Hypertension   . MI, old      Patient Active Problem List   Diagnosis Date Noted  . Primary cancer of supraglottis ([place]) 10/17/2015  . 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 Change in vision 11/23/2014  . Dysphagia 11/23/2014  . Heart disease 11/23/2014  . Hidradenitis suppurativa 11/23/2014  . Hypertension 11/23/2014  . Keloid 11/23/2014  . 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 Dr. Tami Ribas     Prior to Admission medications   Medication Sig Start Date End Date Taking? Authorizing Provider  amLODipine (NORVASC) 10 MG tablet Take 1 tablet (10 mg total) by mouth daily. 10/15/15  Yes Birdie Sons, MD  aspirin EC 81 MG tablet Take 81 mg by mouth daily.   Yes Historical Provider, MD  fluticasone (FLONASE) 50 MCG/ACT nasal spray Place 2 sprays into both nostrils daily. 09/24/14  Yes Birdie Sons, MD  lisinopril-hydrochlorothiazide (PRINZIDE,ZESTORETIC) 10-12.5 MG tablet Take 1 tablet by mouth daily. PATIENT NEEDS TO SCHEDULE OFFICE VISIT FOR FOLLOW UP 10/16/15  Yes Birdie Sons, MD     Allergies Patient has no known allergies.   Family History  Problem Relation Age of Onset  . Diabetes Mother     type 2  . Hypertension Mother   . Diabetes Maternal Uncle     Social History Social History  Substance Use Topics  . Smoking status: Former Research scientist (life sciences)  . Smokeless tobacco: Never Used  . Alcohol use 0.0 oz/week     Comment: Drinks wine once a month    Review of Systems  Constitutional:   No fever or chills.  ENT:   No sore throat. No rhinorrhea. Cardiovascular:   No chest pain. Respiratory:   No dyspnea or cough. Gastrointestinal:   Negative for abdominal pain, vomiting and diarrhea.  Genitourinary:   Negative for dysuria or difficulty urinating. Musculoskeletal:   Negative for focal pain or swelling Neurological:   Positive as above for headache 10-point ROS otherwise negative.  ____________________________________________   PHYSICAL EXAM:  VITAL SIGNS: ED Triage Vitals  Enc Vitals Group     BP 05/28/16 1128 (!) 227/112     Pulse Rate 05/28/16 1128 75     Resp 05/28/16 1128 18     Temp 05/28/16 1128 98.6 F (37 C)     Temp Source 05/28/16  1128 Oral     SpO2 05/28/16 1200 98 %     Weight 05/28/16 1129 205 lb (93 kg)     Height 05/28/16 1129 5\' 7"  (1.702 m)     Head Circumference --      Peak Flow --      Pain Score 05/28/16 1129 0     Pain Loc --      Pain Edu? --      Excl. in [place]? --     Vital signs reviewed, nursing assessments reviewed.   Constitutional:   Alert and oriented. Well appearing and in no distress. Eyes:   No scleral icterus. No conjunctival pallor. PERRL. EOMI.  No nystagmus. ENT   Head:   Normocephalic and atraumatic.   Nose:   No congestion/rhinnorhea. No septal hematoma   Mouth/Throat:   MMM, no pharyngeal  erythema. No peritonsillar mass.    Neck:   No stridor. No SubQ emphysema. No meningismus. Hematological/Lymphatic/Immunilogical:   No cervical lymphadenopathy. Cardiovascular:   RRR. Symmetric bilateral radial and DP pulses.  No murmurs.  Respiratory:   Normal respiratory effort without tachypnea nor retractions. Breath sounds are clear and equal bilaterally. No wheezes/rales/rhonchi. Gastrointestinal:   Soft and nontender. Non distended. There is no CVA tenderness.  No rebound, rigidity, or guarding. Genitourinary:   deferred Musculoskeletal:   Normal range of motion in all extremities. No joint effusions.  No lower extremity tenderness.  No edema. Neurologic:   Normal speech and language.  CN 2-10 normal. Motor grossly intact. No gross focal neurologic deficits are appreciated.  Skin:    Skin is warm, dry and intact. No rash noted.  No petechiae, purpura, or bullae.  ____________________________________________    LABS (pertinent positives/negatives) (all labs ordered are listed, but only abnormal results are displayed) Labs Reviewed  BASIC METABOLIC PANEL - Abnormal; Notable for the following:       Result Value   Glucose, Bld 108 (*)    BUN 23 (*)    Anion gap 4 (*)    All other components within normal limits  CBC - Abnormal; Notable for the following:    WBC 3.1 (*)    RBC 4.11 (*)    All other components within normal limits  TROPONIN I - Abnormal; Notable for the following:    Troponin I 0.03 (*)    All other components within normal limits  TROPONIN I   ____________________________________________   EKG  Interpreted by me Normal sinus rhythm rate of 74, normal axis intervals QRS and ST segments. There are T-wave inversions in V5 and V6 suggestive of lateral ischemia or strain. T wave inversions appear to be new compared to 02/11/2013.  ____________________________________________    RADIOLOGY  Ct Head Wo Contrast  Result Date: 05/28/2016 CLINICAL DATA:   56 year old male with elevated blood pressure and headache. History of laryngeal cancer. EXAM: CT HEAD WITHOUT CONTRAST TECHNIQUE: Contiguous axial images were obtained from the base of the skull through the vertex without intravenous contrast. COMPARISON:  None. FINDINGS: Brain: Patchy and confluent areas of decreased attenuation are noted throughout the deep and periventricular white matter of the cerebral hemispheres bilaterally, compatible with chronic microvascular ischemic disease. No evidence of acute infarction, hemorrhage, hydrocephalus, extra-axial collection or mass lesion/mass effect. Vascular: No hyperdense vessel or unexpected calcification. Skull: Normal. Negative for fracture or focal lesion. Sinuses/Orbits: No acute finding. Other: None. IMPRESSION: 1. No acute intracranial abnormality. 2. Mild chronic microvascular ischemic changes in the cerebral white matter, as above. Electronically  Signed   By: Vinnie Langton M.D.   On: 05/28/2016 12:32    ____________________________________________   PROCEDURES Procedures CRITICAL CARE Performed by: Joni Fears, Rosamary Boudreau   Total critical care time: 35 minutes  Critical care time was exclusive of separately billable procedures and treating other patients.  Critical care was necessary to treat or prevent imminent or life-threatening deterioration.  Critical care was time spent personally by me on the following activities: development of treatment plan with patient and/or surrogate as well as nursing, discussions with consultants, evaluation of patient's response to treatment, examination of patient, obtaining history from patient or surrogate, ordering and performing treatments and interventions, ordering and review of laboratory studies, ordering and review of radiographic studies, pulse oximetry and re-evaluation of patient's condition.  ____________________________________________   INITIAL IMPRESSION / ASSESSMENT AND PLAN / ED  COURSE  Pertinent labs & imaging results that were available during my care of the patient were reviewed by me and considered in my medical decision making (see chart for details).  Patient presents with neurologic symptoms in the setting of severely uncontrolled hypertension due to medication noncompliance. By history his symptoms are not consistent with intracranial hemorrhage, but due to the risk with the hypertension, CT was performed which was negative. EKG shows evidence of lateral repolarization abnormality. Troponin 0.03. I don't think that this is ACS PE or dissection, and believe these findings are related to strain from the uncontrolled blood pressure. Patient was given 2 doses of IV labetalol which decreased the blood pressure to 190/100.   I will restart his home medications which I confirmed in the electronic medical record are amlodipine 10 mg daily and ramipril 10 milligrams daily. Case discussed with the hospitalist for further management.         ____________________________________________   FINAL CLINICAL IMPRESSION(S) / ED DIAGNOSES  Final diagnoses:  Hypertensive encephalopathy  Dizziness  Acute nonintractable headache, unspecified headache type      New Prescriptions   No medications on file     Portions of this note were generated with dragon dictation software. Dictation errors may occur despite best attempts at proofreading.    Carrie Mew, MD 05/28/16 [phone]

## 2016-05-28 NOTE — ED Notes (Signed)
Attempted IV access x 2. Unsuccessful. 

## 2016-05-28 NOTE — Progress Notes (Signed)
Patient here today for follow up.   

## 2016-05-29 ENCOUNTER — Telehealth: Payer: Self-pay | Admitting: Family Medicine

## 2016-05-29 ENCOUNTER — Encounter: Payer: Self-pay | Admitting: *Deleted

## 2016-05-29 LAB — BASIC METABOLIC PANEL
Anion gap: 7 (ref 5–15)
BUN: 21 mg/dL — ABNORMAL HIGH (ref 6–20)
CO2: 28 mmol/L (ref 22–32)
Calcium: 8.9 mg/dL (ref 8.9–10.3)
Chloride: 101 mmol/L (ref 101–111)
Creatinine, Ser: 1.28 mg/dL — ABNORMAL HIGH (ref 0.61–1.24)
GFR calc Af Amer: >60 mL/min (ref >60)
GFR calc non Af Amer: >60 mL/min (ref >60)
Glucose, Bld: 103 mg/dL — ABNORMAL HIGH (ref 65–99)
Potassium: 3.4 mmol/L — ABNORMAL LOW (ref 3.5–5.1)
Sodium: 136 mmol/L (ref 135–145)

## 2016-05-29 LAB — CBC
HCT: 38.5 % — ABNORMAL LOW (ref 40.0–52.0)
Hemoglobin: 13.2 g/dL (ref 13.0–18.0)
MCH: 33.4 pg (ref 26.0–34.0)
MCHC: 34.2 g/dL (ref 32.0–36.0)
MCV: 97.9 fL (ref 80.0–100.0)
PLATELETS: 231 10*3/uL (ref 150–440)
RBC: 3.93 MIL/uL — ABNORMAL LOW (ref 4.40–5.90)
RDW: 13.6 % (ref 11.5–14.5)
WBC: 2.9 10*3/uL — AB (ref 3.8–10.6)

## 2016-05-29 MED ORDER — LISINOPRIL-HYDROCHLOROTHIAZIDE 10-12.5 MG PO TABS: 2.0000 | ORAL_TABLET | Freq: Every day | ORAL | 0 refills | Status: DC

## 2016-05-29 NOTE — Discharge Summary (Signed)
[facility] [place] NAME: Terrance Carter    MR#:  [id number]  DATE OF BIRTH:  Aug 30, 1960  DATE OF ADMISSION:  05/28/2016 ADMITTING PHYSICIAN: Henreitta Leber, MD  DATE OF DISCHARGE: 05/29/2016 10:53 AM  PRIMARY CARE PHYSICIAN: Lelon Huh, MD   ADMISSION DIAGNOSIS:  Hypertensive encephalopathy [I67.4] Dizziness [R42] Acute nonintractable headache, unspecified headache type [R51]  DISCHARGE DIAGNOSIS:  Active Problems:   Hypertensive urgency   SECONDARY DIAGNOSIS:   Past Medical History:  Diagnosis Date  . Cancer (HCC) Laryngeal  . History of chicken pox   . History of measles   . History of mumps   . Hypertension   . MI, old      ADMITTING HISTORY  HISTORY OF PRESENT ILLNESS:  Terrance Carter  is a 56 y.o. male with a known history of Essential hypertension, previous MI, history of laryngeal cancer who presents to the hospital due to elevated blood pressure and dizziness. Patient says he went for his routine follow-up at the cancer center today and noted his systolic blood pressures to be greater than 200, he was also complaining of some dizziness and therefore sent to the ER for further evaluation. In the ER patient was noted to have accelerated hypertension and given multiple doses of IV labetalol but his systolic blood pressures still continue to be higher than 190. Hospitalist services were contacted further treatment and evaluation. Patient denies any chest pain, shortness of breath, nausea, vomiting, abdominal pain, fever chills or any other associated symptoms presently.   HOSPITAL COURSE:   * Accelerated hypertension This is due to not being compliant with medications. Patient was restarted on home medications. His lisinopril and hydrochlorothiazide dose have been doubled. She did receive 1 dose of clonidine. Time of discharge patient's blood pressure is close to normal. At this time I will continue his Norvasc and ending  decrease the dose on the lisinopril and hydrochlorothiazide. No new medications at this time. He will follow-up with his primary care physician. I have counseled him to be compliant with medications. Medications to be added in 1 week if blood pressure continues to be high.  Stable for discharge home.  CONSULTS OBTAINED:    DRUG ALLERGIES:  No Known Allergies  DISCHARGE MEDICATIONS:   Discharge Medication List as of 05/29/2016  9:53 AM    CONTINUE these medications which have CHANGED   Details  lisinopril-hydrochlorothiazide (PRINZIDE,ZESTORETIC) 10-12.5 MG tablet Take 2 tablets by mouth daily., Starting Fri 05/29/2016, Normal      CONTINUE these medications which have NOT CHANGED   Details  amLODipine (NORVASC) 10 MG tablet Take 1 tablet (10 mg total) by mouth daily., Starting Tue 10/15/2015, Normal    aspirin EC 81 MG tablet Take 81 mg by mouth daily., Historical Med    fluticasone (FLONASE) 50 MCG/ACT nasal spray Place 2 sprays into both nostrils daily., Starting Mon 09/24/2014, Normal        Today   VITAL SIGNS:  Blood pressure 128/74, pulse (!) 57, temperature 97.6 F (36.4 C), temperature source Oral, resp. rate 18, height 5\' 7"  (1.702 m), weight 88 kg (194 lb 1.6 oz), SpO2 100 %.  I/O:   Intake/Output Summary (Last 24 hours) at 05/29/16 1516 Last data filed at 05/29/16 0052  Gross per 24 hour  Intake              240 ml  Output  0 ml  Net              240 ml    PHYSICAL EXAMINATION:  Physical Exam  GENERAL:  56 y.o.-year-old patient lying in the bed with no acute distress.  LUNGS: Normal breath sounds bilaterally, no wheezing, rales,rhonchi or crepitation. No use of accessory muscles of respiration.  CARDIOVASCULAR: S1, S2 normal. No murmurs, rubs, or gallops.  ABDOMEN: Soft, non-tender, non-distended. Bowel sounds present. No organomegaly or mass.  NEUROLOGIC: Moves all 4 extremities. PSYCHIATRIC: The patient is alert and oriented x 3.  SKIN: No  obvious rash, lesion, or ulcer.   DATA REVIEW:   CBC  Recent Labs Lab 05/29/16 0441  WBC 2.9*  HGB 13.2  HCT 38.5*  PLT 231    Chemistries   Recent Labs Lab 05/28/16 1000  05/29/16 0441  NA 138  < > 136  K 4.1  < > 3.4*  CL 108  < > 101  CO2 26  < > 28  GLUCOSE 111*  < > 103*  BUN 23*  < > 21*  CREATININE 1.13  < > 1.28*  CALCIUM 8.9  < > 8.9  AST 23  --   --   ALT 15*  --   --   ALKPHOS 61  --   --   BILITOT 0.4  --   --   < > = values in this interval not displayed.  Cardiac Enzymes  Recent Labs Lab 05/28/16 2110  TROPONINI 0.03*    Microbiology Results  No results found for this or any previous visit.  RADIOLOGY:  Ct Head Wo Contrast  Result Date: 05/28/2016 CLINICAL DATA:  56 year old male with elevated blood pressure and headache. History of laryngeal cancer. EXAM: CT HEAD WITHOUT CONTRAST TECHNIQUE: Contiguous axial images were obtained from the base of the skull through the vertex without intravenous contrast. COMPARISON:  None. FINDINGS: Brain: Patchy and confluent areas of decreased attenuation are noted throughout the deep and periventricular white matter of the cerebral hemispheres bilaterally, compatible with chronic microvascular ischemic disease. No evidence of acute infarction, hemorrhage, hydrocephalus, extra-axial collection or mass lesion/mass effect. Vascular: No hyperdense vessel or unexpected calcification. Skull: Normal. Negative for fracture or focal lesion. Sinuses/Orbits: No acute finding. Other: None. IMPRESSION: 1. No acute intracranial abnormality. 2. Mild chronic microvascular ischemic changes in the cerebral white matter, as above. Electronically Signed   By: Vinnie Langton M.D.   On: 05/28/2016 12:32    Follow up with PCP in 1 week.  Management plans discussed with the patient, family and they are in agreement.  CODE STATUS:  Code Status History    Date Active Date Inactive Code Status Order ID Comments User Context    05/28/2016  4:31 PM 05/29/2016  1:53 PM Full Code [id number]  Henreitta Leber, MD ED      TOTAL TIME TAKING CARE OF THIS PATIENT ON DAY OF DISCHARGE: more than 30 minutes.   Hillary Bow R M.D on 05/29/2016 at 3:16 PM  Between 7am to 6pm - Pager - [phone]  After 6pm go to [email or web address] - password [email or web address] [facility]  Office  [phone]  CC: Primary care physician; Lelon Huh, MD  Note: This dictation was prepared with Dragon dictation along with smaller phrase technology. Any transcriptional errors that result from this process are unintentional.

## 2016-05-29 NOTE — Telephone Encounter (Signed)
Pt is being discharged from [facility] today for hypertensive urgency.  I have scheduled an appointment/MW

## 2016-05-29 NOTE — Progress Notes (Signed)
Patient d/c from [facility]. Walked down from hospital into cancer center lab and requested a repeat blood pressure check.  BP taken by Josh, CMA-210/102. Pt states that he now "doesn't feel good" and requested recommendations from Dr. Rogue Bussing. Pt states that he is waiting on his ride to pull up at cancer center location.  RN spoke with Dr. Rogue Bussing.  Per md recommendations- MD asked CMA to advised pt to follow [facility] discharge recommendations. Take his bp medications as directed and f/u with pcp. If symptoms worsen over the weekend, pt will need to go back to ED.  Josh, related the information back to the patient.

## 2016-05-29 NOTE — Discharge Instructions (Signed)
Low salt diet  Activity as tolerated  Take medications as prescribed

## 2016-06-02 ENCOUNTER — Telehealth: Payer: Self-pay | Admitting: Internal Medicine

## 2016-06-02 DIAGNOSIS — C321 Malignant neoplasm of supraglottis: Secondary | ICD-10-CM

## 2016-06-02 NOTE — Telephone Encounter (Signed)
Lab have been ordered for 1 year

## 2016-06-02 NOTE — Addendum Note (Signed)
Addended by: Vernetta Honey on: 06/02/2016 08:41 AM   Modules accepted: Orders

## 2016-06-02 NOTE — Telephone Encounter (Signed)
Needs follow-up in one year CBC and CMP; Thx

## 2016-06-05 ENCOUNTER — Inpatient Hospital Stay: Payer: Medicaid Other | Admitting: Family Medicine

## 2016-06-05 ENCOUNTER — Telehealth: Payer: Self-pay | Admitting: *Deleted

## 2016-06-05 NOTE — Progress Notes (Deleted)
       Patient: Terrance Carter Male    DOB: [birth year]   56 y.o.   MRN: [id number] Visit Date: 06/05/2016  Today's Provider: Lelon Huh, MD   No chief complaint on file.  Subjective:    HPI Hospital follow up- Admitted 05/28/16 discharged 05/29/16 Diagnoses-  Hypertensive encephalopathy  He was given one does of clonidine in the hospital and doubled his Lisinopril and HCTZ. Pt to follow up with PCP in 1 week.     No Known Allergies   Current Outpatient Prescriptions:  .  amLODipine (NORVASC) 10 MG tablet, Take 1 tablet (10 mg total) by mouth daily., Disp: 90 tablet, Rfl: 1 .  aspirin EC 81 MG tablet, Take 81 mg by mouth daily., Disp: , Rfl:  .  fluticasone (FLONASE) 50 MCG/ACT nasal spray, Place 2 sprays into both nostrils daily., Disp: 16 g, Rfl: 3 .  lisinopril-hydrochlorothiazide (PRINZIDE,ZESTORETIC) 10-12.5 MG tablet, Take 2 tablets by mouth daily., Disp: 60 tablet, Rfl: 0  Review of Systems  Constitutional: Negative.   HENT: Negative.   Eyes: Negative.   Respiratory: Negative.   Cardiovascular: Negative.   Gastrointestinal: Negative.   Endocrine: Negative.   Genitourinary: Negative.   Musculoskeletal: Negative.   Allergic/Immunologic: Negative.   Neurological: Negative.   Hematological: Negative.   Psychiatric/Behavioral: Negative.     Social History  Substance Use Topics  . Smoking status: Former Smoker    Packs/day: 1.00    Years: 20.00  . Smokeless tobacco: Never Used  . Alcohol use 0.0 oz/week     Comment: Drinks wine once a month   Objective:   There were no vitals taken for this visit. There were no vitals filed for this visit.   Physical Exam      Assessment & Plan:           Lelon Huh, MD  [facility]

## 2016-06-05 NOTE — Telephone Encounter (Signed)
Contacted patient to discuss lung screening scan. However, patient only has a history of smoking black and mild cigars. Denies never smoking cigarettes. Discussed current recommendations for lung screening only related to cigarettes, which have a much higher risk for lung cancer than cigar use. Patient verbalizes understanding.

## 2017-03-26 ENCOUNTER — Encounter: Payer: Self-pay | Admitting: Family Medicine

## 2017-03-26 ENCOUNTER — Ambulatory Visit: Payer: Medicaid Other | Admitting: Family Medicine

## 2017-03-26 VITALS — BP 168/104 | HR 96 | Temp 98.1°F | Resp 16 | Ht 67.0 in | Wt 201.0 lb

## 2017-03-26 DIAGNOSIS — I119 Hypertensive heart disease without heart failure: Secondary | ICD-10-CM | POA: Diagnosis not present

## 2017-03-26 DIAGNOSIS — R490 Dysphonia: Secondary | ICD-10-CM

## 2017-03-26 DIAGNOSIS — R079 Chest pain, unspecified: Secondary | ICD-10-CM | POA: Diagnosis not present

## 2017-03-26 DIAGNOSIS — I43 Cardiomyopathy in diseases classified elsewhere: Secondary | ICD-10-CM | POA: Diagnosis not present

## 2017-03-26 DIAGNOSIS — I10 Essential (primary) hypertension: Secondary | ICD-10-CM

## 2017-03-26 DIAGNOSIS — R42 Dizziness and giddiness: Secondary | ICD-10-CM | POA: Diagnosis not present

## 2017-03-26 MED ORDER — NITROGLYCERIN 0.4 MG SL SUBL: SUBLINGUAL_TABLET | SUBLINGUAL | 1 refills | Status: DC

## 2017-03-26 MED ORDER — AMLODIPINE BESYLATE 10 MG PO TABS: 10.0000 mg | ORAL_TABLET | Freq: Every day | ORAL | 1 refills | Status: DC

## 2017-03-26 MED ORDER — LISINOPRIL-HYDROCHLOROTHIAZIDE 10-12.5 MG PO TABS: 2.0000 | ORAL_TABLET | Freq: Two times a day (BID) | ORAL | 0 refills | Status: DC

## 2017-03-26 NOTE — Patient Instructions (Addendum)
   Take another dose of lisinopril-hctz and amlodipine this evening, then take one of each every day   Recommend taking 81mg  enteric coated aspirin to reduce risk of vascular events such as heart attacks and strokes.

## 2017-03-26 NOTE — Progress Notes (Signed)
Patient: Terrance Carter Male    DOB: November 18, 1960   57 y.o.   MRN: [id number] Visit Date: 03/26/2017  Today's Provider: Lelon Huh, MD   Chief Complaint  Patient presents with  . Follow-up  . Hypertension   Subjective:    HPI    Hypertension, follow-up:  BP Readings from Last 3 Encounters:  03/26/17 (!) 190/120  05/29/16 128/74  05/28/16 (!) 221/118    He was last seen for hypertension 4 months ago.  BP at that visit was 164/88. Management since that visit includes; changed Ramipril to Lisinopril-HCTZ 10-12.5 mg bid. Continue amlodipine 10 mg qd.He reports poor compliance with treatment.  He has not had follow up here since 2016. He was admitted to [facility] from the [facility] in March due to malignant hypertension. Had admits that he only takes his BP medications occasionally. He states he did take both medications this morning, but that was the first time in a couple of weeks. He reports having brief episodes of sharp pains in his chest which last a few second, but episodes are usually when he is laying down at night, but occasionally occurs during exertion.   He has a history of CAD and CHF, previously followed by Dr. Clayborn Bigness, but patient has not returned for follow up since 2016.   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 Tami Ribas. He continues to follow up periodically at cancer center, but states he has been having persistent hoarsness for the last few months.    No Known Allergies   Current Outpatient Medications:  .  amLODipine (NORVASC) 10 MG tablet, Take 1 tablet (10 mg total) by mouth daily., Disp: 90 tablet, Rfl: 1 .  aspirin EC 81 MG tablet, Take 81 mg by mouth daily., Disp: , Rfl:  .  fluticasone (FLONASE) 50 MCG/ACT nasal spray, Place 2 sprays into both nostrils daily., Disp: 16 g, Rfl: 3 .  lisinopril-hydrochlorothiazide (PRINZIDE,ZESTORETIC) 10-12.5 MG tablet, Take 2 tablets by mouth daily., Disp: 60 tablet, Rfl:  0  Review of Systems  Constitutional: Negative for appetite change, chills and fever.  Respiratory: Positive for chest tightness and shortness of breath. Negative for wheezing.   Cardiovascular: Positive for chest pain and palpitations.  Gastrointestinal: Negative for abdominal pain, nausea and vomiting.    Social History   Tobacco Use  . Smoking status: Former Smoker    Packs/day: 1.00    Years: 20.00    Pack years: 20.00  . Smokeless tobacco: Never Used  Substance Use Topics  . Alcohol use: Yes    Alcohol/week: 0.0 oz    Comment: Drinks wine once a month   Objective:   BP (!) 190/120 (BP Location: Left Arm, Patient Position: Sitting, Cuff Size: Large)   Pulse 96   Temp 98.1 F (36.7 C) (Oral)   Resp 16   Ht 5\' 7"  (1.702 m)   Wt 201 lb (91.2 kg)   SpO2 98%   BMI 31.48 kg/m  Vitals:   03/26/17 1627 03/26/17 1714  BP: (!) 190/120 (!) 168/104  Pulse: 96   Resp: 16   Temp: 98.1 F (36.7 C)   TempSrc: Oral   SpO2: 98%   Weight: 201 lb (91.2 kg)   Height: 5\' 7"  (1.702 m)      Physical Exam   General Appearance:    Alert, cooperative, no distress  Eyes:    PERRL, conjunctiva/corneas clear, EOM's intact  Lungs:     Clear to auscultation bilaterally, respirations unlabored  Heart:    Regular rate and rhythm, II/VI systolic murmur  Neurologic:   Awake, alert, oriented x 3. No apparent focal neurological           defect.           Assessment & Plan:     1. Chest pain, unspecified type No chest pain at time of visit, but high risk. Advised to start 81mg  ASA daily, get BP under control, prescription prn NG, and refer back to cardiology.  - EKG 12-Lead - Ambulatory referral to Cardiology - nitroGLYCERIN (NITROSTAT) 0.4 MG SL tablet; One tablet under tongue every 5 minutes as needed for chest pain. go to ER if chest pain is not resolved after 3 doses.  Dispense: 30 tablet; Refill: 1  2. Essential hypertension Poor compliance.   3. Dizziness Resolved.    4. Malignant hypertension Patient was given 0.4 SL nitroglycerine after initial BP and EKG were done taken. After 10 minute BP was 180/110. He was given a second nitroglycerine. After another 10 minutes BP was 168/104.   He was extensively counseled on risk of MI, heart failure and stroke associated with malignant hypertension. Rx for  - amLODipine (NORVASC) 10 MG tablet; Take 1 tablet (10 mg total) by mouth daily.  Dispense: 30 tablet; Refill: 1 - lisinopril-hydrochlorothiazide (PRINZIDE,ZESTORETIC) 10-12.5 MG tablet; Take 2 tablets by mouth 2 (two) times daily.  Dispense: 60 tablet; Refill: 0  Sent to pharmacy and was advised to take additional dose of each today, then start one daily tomorrow. He to return for BP check next week. He was completely asymptomatic at the time of discharge, but advised to go to ER if he develops any chest pain, shortness of breath or dizziness before his follow up appointment.   5. Cardiomyopathy due to hypertension, without heart failure [facility]) Is overdue for cardiology follow up.  - Ambulatory referral to Cardiology  6. Hoarseness Consider history of supraglottic tumor he needs follow up with ENT.  - Ambulatory referral to ENT  Addressed extensive list of chronic and acute medical problems today requiring extensive time in counseling and coordination of care.  Over half of this 45 minute visit were spent in counseling and coordinating care of multiple medical problems.        Lelon Huh, MD  [facility]

## 2017-03-29 ENCOUNTER — Ambulatory Visit: Payer: Self-pay | Admitting: Family Medicine

## 2017-03-31 ENCOUNTER — Encounter: Payer: Self-pay | Admitting: Family Medicine

## 2017-03-31 ENCOUNTER — Ambulatory Visit: Payer: Medicaid Other | Admitting: Family Medicine

## 2017-03-31 VITALS — BP 148/78 | HR 78 | Temp 97.9°F | Resp 16 | Ht 67.0 in | Wt 196.0 lb

## 2017-03-31 DIAGNOSIS — I519 Heart disease, unspecified: Secondary | ICD-10-CM

## 2017-03-31 DIAGNOSIS — Z1211 Encounter for screening for malignant neoplasm of colon: Secondary | ICD-10-CM | POA: Diagnosis not present

## 2017-03-31 DIAGNOSIS — Z136 Encounter for screening for cardiovascular disorders: Secondary | ICD-10-CM

## 2017-03-31 DIAGNOSIS — I10 Essential (primary) hypertension: Secondary | ICD-10-CM | POA: Diagnosis not present

## 2017-03-31 NOTE — Progress Notes (Signed)
Patient: Terrance Carter Male    DOB: 06-Dec-1960   57 y.o.   MRN: [id number] Visit Date: 03/31/2017  Today's Provider: Lelon Huh, MD   Chief Complaint  Patient presents with  . Hypertension   Subjective:    HPI  Hypertension, follow-up:  BP Readings from Last 3 Encounters:  03/31/17 (!) 148/78  03/26/17 (!) 168/104  05/29/16 128/74      He was last seen for hypertension 1 weeks ago.  BP at that visit was initially 190/120, but dropped to 168/104 after two doses of nitroglycerine.  Management since that visit includes restarting lisinopril-hctz and amlodipine, adding ASA 81mg  and prescribing prn nitroglycerine.   He reports good compliance with treatment. He has had to taking nitroglycerine about 3 times and was effective each time at relieving chest pain. He had follow up with Dr. Clayborn Bigness this morning. Lisinopril-hctz was changed to losartain-hctz and metoprolol was added, but he has not yet started these medications. He is anticipating stress test next month.  He feels well today.  He is not having side effects.  He is not exercising. He is not adherent to low salt diet.   Outside blood pressures are not being checked.  He reports that his chest pain is much better. He has not had any episodes since last week. He has also been compliant with taking his BP medication.   Wt Readings from Last 3 Encounters:  03/31/17 196 lb (88.9 kg)  03/26/17 201 lb (91.2 kg)  05/28/16 194 lb 1.6 oz (88 kg)           No Known Allergies   Current Outpatient Medications:  .  aspirin EC 81 MG tablet, Take 81 mg by mouth daily., Disp: , Rfl:  .  fluticasone (FLONASE) 50 MCG/ACT nasal spray, Place 2 sprays into both nostrils daily., Disp: 16 g, Rfl: 3 .  lisinopril-hydrochlorothiazide (PRINZIDE,ZESTORETIC) 10-12.5 MG tablet, Take 2 tablets by mouth 2 (two) times daily., Disp: 60 tablet, Rfl: 0 .  nitroGLYCERIN (NITROSTAT) 0.4 MG SL tablet, One tablet under tongue every 5  minutes as needed for chest pain. go to ER if chest pain is not resolved after 3 doses., Disp: 30 tablet, Rfl: 1 .  amLODipine (NORVASC) 10 MG tablet, Take 1 tablet (10 mg total) by mouth daily., Disp: 30 tablet, Rfl: 1  Review of Systems  Constitutional: Negative.   Respiratory: Negative.   Cardiovascular: Negative.  Negative for chest pain, palpitations and leg swelling.  Neurological: Negative for dizziness, tremors, seizures, syncope, speech difficulty, weakness, light-headedness, numbness and headaches.    Social History   Tobacco Use  . Smoking status: Former Smoker    Packs/day: 1.00    Years: 20.00    Pack years: 20.00  . Smokeless tobacco: Never Used  Substance Use Topics  . Alcohol use: Yes    Alcohol/week: 0.0 oz    Comment: Drinks wine once a month   Objective:   BP (!) 148/78 (BP Location: Right Arm, Patient Position: Sitting, Cuff Size: Normal)   Pulse 78   Temp 97.9 F (36.6 C)   Resp 16   Ht 5\' 7"  (1.702 m)   Wt 196 lb (88.9 kg)   SpO2 98%   BMI 30.70 kg/m  Vitals:   03/31/17 1108  BP: (!) 148/78  Pulse: 78  Resp: 16  Temp: 97.9 F (36.6 C)  SpO2: 98%  Weight: 196 lb (88.9 kg)  Height: 5\' 7"  (1.702 m)  Physical Exam  General appearance: alert, well developed, well nourished, cooperative and in no distress Head: Normocephalic, without obvious abnormality, atraumatic Respiratory: Respirations even and unlabored, normal respiratory rate Extremities: No gross deformities Skin: Skin color, texture, turgor normal. No rashes seen  Psych: Appropriate mood and affect. Neurologic: Mental status: Alert, oriented to person, place, and time, thought content appropriate.     Assessment & Plan:     1. Malignant hypertension Much better since getting back on his regular BP medications. Expect further improvement after making changes prescribed by Dr. Clayborn Bigness. Check labs.  - Comprehensive metabolic panel - Lipid panel  2. Colon cancer screening  -  Cologuard  3. Encounter for special screening examination for cardiovascular disorder  - Lipid panel  4. Heart disease  - Lipid panel       Lelon Huh, MD  [facility]

## 2017-04-01 ENCOUNTER — Telehealth: Payer: Self-pay

## 2017-04-01 DIAGNOSIS — E785 Hyperlipidemia, unspecified: Secondary | ICD-10-CM

## 2017-04-01 LAB — COMPREHENSIVE METABOLIC PANEL
ALBUMIN: 4.7 g/dL (ref 3.5–5.5)
ALT: 19 IU/L (ref 0–44)
AST: 20 IU/L (ref 0–40)
Albumin/Globulin Ratio: 1.7 (ref 1.2–2.2)
Alkaline Phosphatase: 62 IU/L (ref 39–117)
BUN / CREAT RATIO: 17 (ref 9–20)
BUN: 26 mg/dL — AB (ref 6–24)
Bilirubin Total: 0.6 mg/dL (ref 0.0–1.2)
CO2: 25 mmol/L (ref 20–29)
CREATININE: 1.56 mg/dL — AB (ref 0.76–1.27)
Calcium: 10.3 mg/dL — ABNORMAL HIGH (ref 8.7–10.2)
Chloride: 99 mmol/L (ref 96–106)
GFR, EST AFRICAN AMERICAN: 56 mL/min/{1.73_m2} — AB (ref >59)
GFR, EST NON AFRICAN AMERICAN: 48 mL/min/{1.73_m2} — AB (ref >59)
GLOBULIN, TOTAL: 2.7 g/dL (ref 1.5–4.5)
GLUCOSE: 99 mg/dL (ref 65–99)
Potassium: 4.1 mmol/L (ref 3.5–5.2)
Sodium: 140 mmol/L (ref 134–144)
TOTAL PROTEIN: 7.4 g/dL (ref 6.0–8.5)

## 2017-04-01 LAB — LIPID PANEL
CHOL/HDL RATIO: 3.4 ratio (ref 0.0–5.0)
Cholesterol, Total: 175 mg/dL (ref 100–199)
HDL: 51 mg/dL (ref >39)
LDL CALC: 101 mg/dL — AB (ref 0–99)
Triglycerides: 115 mg/dL (ref 0–149)
VLDL CHOLESTEROL CAL: 23 mg/dL (ref 5–40)

## 2017-04-01 MED ORDER — ATORVASTATIN CALCIUM 20 MG PO TABS: 20.0000 mg | ORAL_TABLET | Freq: Every day | ORAL | 3 refills | Status: DC

## 2017-04-01 NOTE — Telephone Encounter (Signed)
-----   Message from Birdie Sons, MD sent at 04/01/2017  7:56 AM EST ----- Kidney functions have declined a bit which is caused by high blood pressure. Should improve now that BP is under control. Should also drink more water. LDL cholesterol is 101, should be under 70 to reduce risk of heart attack. Need to start atorvastatin 20mg  once a day, #30, rf x 3. Schedule follow up in 3 months

## 2017-04-01 NOTE — Telephone Encounter (Signed)
Advised patient as below. Medication was sent into the pharmacy. Appt was scheduled in 3 months.

## 2017-04-02 ENCOUNTER — Ambulatory Visit: Payer: Self-pay | Admitting: Family Medicine

## 2017-04-19 DIAGNOSIS — Z8521 Personal history of malignant neoplasm of larynx: Secondary | ICD-10-CM | POA: Diagnosis not present

## 2017-04-19 DIAGNOSIS — R490 Dysphonia: Secondary | ICD-10-CM | POA: Diagnosis not present

## 2017-04-20 ENCOUNTER — Other Ambulatory Visit: Payer: Self-pay | Admitting: Internal Medicine

## 2017-04-20 DIAGNOSIS — I208 Other forms of angina pectoris: Secondary | ICD-10-CM

## 2017-04-20 DIAGNOSIS — I2089 Other forms of angina pectoris: Secondary | ICD-10-CM

## 2017-04-20 DIAGNOSIS — R0602 Shortness of breath: Secondary | ICD-10-CM

## 2017-04-21 ENCOUNTER — Telehealth: Payer: Self-pay | Admitting: Family Medicine

## 2017-04-21 NOTE — Telephone Encounter (Signed)
Order for cologuard faxed to [facility] °

## 2017-05-10 ENCOUNTER — Other Ambulatory Visit: Payer: Self-pay | Admitting: Internal Medicine

## 2017-05-10 DIAGNOSIS — R0602 Shortness of breath: Secondary | ICD-10-CM

## 2017-05-10 DIAGNOSIS — I208 Other forms of angina pectoris: Secondary | ICD-10-CM

## 2017-05-10 DIAGNOSIS — I429 Cardiomyopathy, unspecified: Secondary | ICD-10-CM

## 2017-05-14 ENCOUNTER — Telehealth: Payer: Self-pay | Admitting: Family Medicine

## 2017-05-14 DIAGNOSIS — I10 Essential (primary) hypertension: Secondary | ICD-10-CM

## 2017-05-14 MED ORDER — AMLODIPINE BESYLATE 10 MG PO TABS: 10.0000 mg | ORAL_TABLET | Freq: Every day | ORAL | 5 refills | Status: DC

## 2017-05-14 MED ORDER — LOSARTAN POTASSIUM-HCTZ 50-12.5 MG PO TABS: 1.0000 | ORAL_TABLET | Freq: Every day | ORAL | 11 refills | Status: DC

## 2017-05-14 MED ORDER — METOPROLOL SUCCINATE ER 25 MG PO TB24: 12.5000 mg | ORAL_TABLET | Freq: Every day | ORAL | 11 refills | Status: DC

## 2017-05-14 NOTE — Telephone Encounter (Signed)
Patient requesting refill on his   amLODipine (NORVASC) 10 MG tablet   metoprolol succinate (TOPROL-XL) 25 MG 24 hr tablet   losartan-hydrochlorothiazide (HYZAAR) 50-12.5 MG tablet   PATIENT IS COMPLETELY OUT    [employer]

## 2017-05-18 ENCOUNTER — Ambulatory Visit
Admission: RE | Admit: 2017-05-18 | Discharge: 2017-05-18 | Disposition: A | Discharge status: Home / Self Care | Payer: Medicaid Other | Source: Ambulatory Visit | Attending: Internal Medicine | Admitting: Internal Medicine

## 2017-05-18 DIAGNOSIS — R0602 Shortness of breath: Secondary | ICD-10-CM | POA: Insufficient documentation

## 2017-05-18 DIAGNOSIS — I429 Cardiomyopathy, unspecified: Secondary | ICD-10-CM | POA: Diagnosis not present

## 2017-05-18 DIAGNOSIS — I208 Other forms of angina pectoris: Secondary | ICD-10-CM | POA: Insufficient documentation

## 2017-05-18 DIAGNOSIS — R9439 Abnormal result of other cardiovascular function study: Secondary | ICD-10-CM | POA: Insufficient documentation

## 2017-05-18 MED ORDER — TECHNETIUM TC 99M TETROFOSMIN IV KIT
32.2100 | PACK | Freq: Once | INTRAVENOUS | Status: AC | PRN
  Administered 2017-05-18: 32.21 via INTRAVENOUS

## 2017-05-18 MED ORDER — TECHNETIUM TC 99M TETROFOSMIN IV KIT
13.7700 | PACK | Freq: Once | INTRAVENOUS | Status: AC | PRN
  Administered 2017-05-18: 13.77 via INTRAVENOUS

## 2017-05-18 MED ORDER — REGADENOSON 0.4 MG/5ML IV SOLN
0.4000 mg | Freq: Once | INTRAVENOUS | Status: AC
  Administered 2017-05-18: 0.4 mg via INTRAVENOUS

## 2017-05-18 NOTE — Progress Notes (Signed)
*  PRELIMINARY RESULTS* Echocardiogram 2D Echocardiogram has been performed.  Terrance Carter 05/18/2017, 11:44 AM

## 2017-05-28 ENCOUNTER — Ambulatory Visit: Payer: Medicaid Other | Admitting: Internal Medicine

## 2017-05-28 ENCOUNTER — Other Ambulatory Visit: Payer: Medicaid Other

## 2017-06-14 ENCOUNTER — Other Ambulatory Visit: Payer: Self-pay | Admitting: Family Medicine

## 2017-06-14 DIAGNOSIS — I10 Essential (primary) hypertension: Secondary | ICD-10-CM

## 2017-06-14 MED ORDER — LOSARTAN POTASSIUM-HCTZ 50-12.5 MG PO TABS: 1.0000 | ORAL_TABLET | Freq: Every day | ORAL | 11 refills | Status: DC

## 2017-06-14 MED ORDER — AMLODIPINE BESYLATE 10 MG PO TABS: 10.0000 mg | ORAL_TABLET | Freq: Every day | ORAL | 5 refills | Status: DC

## 2017-06-14 MED ORDER — METOPROLOL SUCCINATE ER 25 MG PO TB24: 12.5000 mg | ORAL_TABLET | Freq: Every day | ORAL | 11 refills | Status: DC

## 2017-06-14 NOTE — Telephone Encounter (Signed)
Patient needs refills on Amlodipine 10 mg, Hyzaar 50-12.5 mg, Metoprolol 25 mg. To [facility] on [address]

## 2017-06-15 ENCOUNTER — Other Ambulatory Visit: Payer: Self-pay | Admitting: *Deleted

## 2017-06-15 DIAGNOSIS — C321 Malignant neoplasm of supraglottis: Secondary | ICD-10-CM

## 2017-06-16 ENCOUNTER — Inpatient Hospital Stay: Payer: Medicaid Other | Attending: Internal Medicine

## 2017-06-16 ENCOUNTER — Other Ambulatory Visit: Payer: Self-pay

## 2017-06-16 ENCOUNTER — Inpatient Hospital Stay: Payer: Medicaid Other | Admitting: Internal Medicine

## 2017-06-16 ENCOUNTER — Inpatient Hospital Stay (HOSPITAL_BASED_OUTPATIENT_CLINIC_OR_DEPARTMENT_OTHER): Payer: Medicaid Other | Admitting: Internal Medicine

## 2017-06-16 VITALS — BP 159/95 | HR 82 | Temp 97.6°F | Resp 20 | Ht 67.0 in | Wt 196.0 lb

## 2017-06-16 DIAGNOSIS — N183 Chronic kidney disease, stage 3 (moderate): Secondary | ICD-10-CM | POA: Insufficient documentation

## 2017-06-16 DIAGNOSIS — I129 Hypertensive chronic kidney disease with stage 1 through stage 4 chronic kidney disease, or unspecified chronic kidney disease: Secondary | ICD-10-CM | POA: Diagnosis not present

## 2017-06-16 DIAGNOSIS — I252 Old myocardial infarction: Secondary | ICD-10-CM | POA: Diagnosis not present

## 2017-06-16 DIAGNOSIS — C321 Malignant neoplasm of supraglottis: Secondary | ICD-10-CM

## 2017-06-16 DIAGNOSIS — Z8521 Personal history of malignant neoplasm of larynx: Secondary | ICD-10-CM | POA: Diagnosis not present

## 2017-06-16 DIAGNOSIS — Z79899 Other long term (current) drug therapy: Secondary | ICD-10-CM | POA: Diagnosis not present

## 2017-06-16 DIAGNOSIS — Z87891 Personal history of nicotine dependence: Secondary | ICD-10-CM | POA: Diagnosis not present

## 2017-06-16 DIAGNOSIS — Z923 Personal history of irradiation: Secondary | ICD-10-CM | POA: Insufficient documentation

## 2017-06-16 DIAGNOSIS — Z9221 Personal history of antineoplastic chemotherapy: Secondary | ICD-10-CM

## 2017-06-16 DIAGNOSIS — Z7982 Long term (current) use of aspirin: Secondary | ICD-10-CM | POA: Insufficient documentation

## 2017-06-16 LAB — COMPREHENSIVE METABOLIC PANEL
ALK PHOS: 57 U/L (ref 38–126)
ALT: 18 U/L (ref 17–63)
AST: 25 U/L (ref 15–41)
Albumin: 4.2 g/dL (ref 3.5–5.0)
Anion gap: 9 (ref 5–15)
BILIRUBIN TOTAL: 0.6 mg/dL (ref 0.3–1.2)
BUN: 38 mg/dL — AB (ref 6–20)
CALCIUM: 9.2 mg/dL (ref 8.9–10.3)
CHLORIDE: 101 mmol/L (ref 101–111)
CO2: 26 mmol/L (ref 22–32)
CREATININE: 1.62 mg/dL — AB (ref 0.61–1.24)
GFR, EST AFRICAN AMERICAN: 52 mL/min — AB (ref >60)
GFR, EST NON AFRICAN AMERICAN: 45 mL/min — AB (ref >60)
Glucose, Bld: 117 mg/dL — ABNORMAL HIGH (ref 65–99)
Potassium: 4 mmol/L (ref 3.5–5.1)
Sodium: 136 mmol/L (ref 135–145)
TOTAL PROTEIN: 7.4 g/dL (ref 6.5–8.1)

## 2017-06-16 LAB — CBC WITH DIFFERENTIAL/PLATELET
BASOS PCT: 1 %
Basophils Absolute: 0 10*3/uL (ref 0–0.1)
EOS ABS: 0.1 10*3/uL (ref 0–0.7)
EOS PCT: 2 %
HCT: 37.1 % — ABNORMAL LOW (ref 40.0–52.0)
Hemoglobin: 12.6 g/dL — ABNORMAL LOW (ref 13.0–18.0)
LYMPHS ABS: 0.6 10*3/uL — AB (ref 1.0–3.6)
Lymphocytes Relative: 14 %
MCH: 33.2 pg (ref 26.0–34.0)
MCHC: 34 g/dL (ref 32.0–36.0)
MCV: 97.5 fL (ref 80.0–100.0)
Monocytes Absolute: 0.5 10*3/uL (ref 0.2–1.0)
Monocytes Relative: 12 %
NEUTROS PCT: 71 %
Neutro Abs: 2.9 10*3/uL (ref 1.4–6.5)
Platelets: 256 10*3/uL (ref 150–440)
RBC: 3.8 MIL/uL — AB (ref 4.40–5.90)
RDW: 13.8 % (ref 11.5–14.5)
WBC: 4.1 10*3/uL (ref 3.8–10.6)

## 2017-06-16 LAB — TSH: TSH: 1.982 u[IU]/mL (ref 0.350–4.500)

## 2017-06-16 NOTE — Assessment & Plan Note (Addendum)
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  # Clinically no evidence of recurrence; I recommend continued follow-up/evaluation with Dr. Tami Ribas ENT.   # Elevated Blood pressure-160s/95 slight headaches.  Had a long discussion with the patient regarding importance of better controlling his blood pressures and increasing risk of-strokes heart attacks worsening peripheral vascular disease; and kidney problems.  # CKD stage III-secondary to poorly controlled blood pressure.  I suspect compliance; he agrees.  Reiterated the importance of taking his medication regular basis.  patient's creatinine 1.62./Slowly rising over the last year 2-3 years.  Again discussed at length regarding control of his blood pressures.  # labs/ MD/ in 1 year- TSH/labs. Recommend follow up with PCP re: elevated blood pressure.   # 25 minutes face-to-face with the patient discussing the above plan of care; more than 50% of time spent on prognosis/ natural history; counseling and coordination.   Dr.Fisher

## 2017-06-16 NOTE — Progress Notes (Unsigned)
[place] OFFICE PROGRESS NOTE  Patient Care Team: Birdie Sons, MD as PCP - General (Family Medicine) Yolonda Kida, MD as Consulting Physician (Cardiology) Leia Alf, MD (Inactive) as Attending Physician (Internal Medicine)  Cancer Staging No matching staging information was found for the patient.   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 Completed radiation along with concurrent weekly cisplatin chemotherapy mid-February 2013. [Dr.McQueen; Dr.Crystal]     Primary cancer of supraglottis ([place])   10/17/2015 Initial Diagnosis    Primary cancer of supraglottis ([place])         INTERVAL HISTORY:  Terrance Carter 57 y.o.  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  Patient complains of intermittent dizziness. He also complains of headaches. Patient admits to chronic mild difficulty swallowing which is not any worse. He denies any pain neck pain. Denies any unusual or symptoms otherwise. Denies any new lumps or bumps.  Appetite is good. No weight loss. No tingling and numbness. Complains of fatigue.  REVIEW OF SYSTEMS:  A complete 10 point review of system is done which is negative except mentioned above/history of present illness.   PAST MEDICAL HISTORY :  Past Medical History:  Diagnosis Date  . Cancer (HCC) Laryngeal  . History of chicken pox   . History of measles   . History of mumps   . Hypertension   . MI, old     PAST SURGICAL HISTORY :   Past Surgical History:  Procedure Laterality Date  . 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 Dr. Tami Ribas    FAMILY HISTORY :   Family History  Problem Relation Age of Onset  . Diabetes Mother        type 2  . Hypertension Mother   .  Diabetes Maternal Uncle     SOCIAL HISTORY:   Social History   Tobacco Use  . Smoking status: Former Smoker    Packs/day: 1.00    Years: 20.00    Pack years: 20.00  . Smokeless tobacco: Never Used  Substance Use Topics  . Alcohol use: Yes    Alcohol/week: 0.0 oz    Comment: Drinks wine once a month  . Drug use: No    ALLERGIES:  has No Known Allergies.  MEDICATIONS:  Current Outpatient Medications  Medication Sig Dispense Refill  . amLODipine (NORVASC) 10 MG tablet Take 1 tablet (10 mg total) by mouth daily. 30 tablet 5  . aspirin EC 81 MG tablet Take 81 mg by mouth daily.    Marland Kitchen atorvastatin (LIPITOR) 20 MG tablet Take 1 tablet (20 mg total) by mouth daily. 30 tablet 3  . fluticasone (FLONASE) 50 MCG/ACT nasal spray Place 2 sprays into both nostrils daily. 16 g 3  . losartan-hydrochlorothiazide (HYZAAR) 50-12.5 MG tablet Take 1 tablet by mouth daily. 30 tablet 11  . metoprolol succinate (TOPROL-XL) 25 MG 24 hr tablet Take 0.5 tablets (12.5 mg total) by mouth daily. 15 tablet 11  . nitroGLYCERIN (NITROSTAT) 0.4 MG SL tablet One tablet under tongue every 5 minutes as needed for chest pain. go to ER if chest pain is not resolved after 3 doses. 30 tablet 1   No current facility-administered medications for this visit.     PHYSICAL EXAMINATION: ECOG PERFORMANCE STATUS: 0 -  Asymptomatic  There were no vitals taken for this visit.  There were no vitals filed for this visit.  GENERAL: Well-nourished well-developed; Alert, no distress and comfortable.  Alone.  EYES: no pallor or icterus OROPHARYNX: no thrush or ulceration; good dentition  NECK: supple, no masses felt LYMPH:  no palpable lymphadenopathy in the cervical, axillary or inguinal regions LUNGS: clear to auscultation and  No wheeze or crackles HEART/CVS: regular rate & rhythm and no murmurs; No lower extremity edema ABDOMEN:abdomen soft, non-tender and normal bowel sounds Musculoskeletal:no cyanosis of digits and no  clubbing  PSYCH: alert & oriented x 3 with fluent speech NEURO: no focal motor/sensory deficits SKIN:  no rashes or significant lesions  LABORATORY DATA:  I have reviewed the data as listed    Component Value Date/Time   NA 140 03/31/2017 1151   NA 132 (L) 02/11/2013 0627   K 4.1 03/31/2017 1151   K 3.7 02/11/2013 0627   CL 99 03/31/2017 1151   CL 100 02/11/2013 0627   CO2 25 03/31/2017 1151   CO2 23 02/11/2013 0627   GLUCOSE 99 03/31/2017 1151   GLUCOSE 103 (H) 05/29/2016 0441   GLUCOSE 163 (H) 02/11/2013 0627   BUN 26 (H) 03/31/2017 1151   BUN 24 (H) 02/11/2013 0627   CREATININE 1.56 (H) 03/31/2017 1151   CREATININE 1.15 02/21/2014 1210   CALCIUM 10.3 (H) 03/31/2017 1151   CALCIUM 8.7 02/21/2014 1210   PROT 7.4 03/31/2017 1151   PROT 7.6 02/21/2014 1210   ALBUMIN 4.7 03/31/2017 1151   ALBUMIN 3.8 02/21/2014 1210   AST 20 03/31/2017 1151   AST 31 02/21/2014 1210   ALT 19 03/31/2017 1151   ALT 29 02/21/2014 1210   ALKPHOS 62 03/31/2017 1151   ALKPHOS 86 02/21/2014 1210   BILITOT 0.6 03/31/2017 1151   BILITOT 0.6 02/21/2014 1210   GFRNONAA 48 (L) 03/31/2017 1151   GFRNONAA >60 02/21/2014 1210   GFRNONAA >60 08/14/2013 1100   GFRAA 56 (L) 03/31/2017 1151   GFRAA >60 02/21/2014 1210   GFRAA >60 08/14/2013 1100    No results found for: SPEP, UPEP  Lab Results  Component Value Date   WBC 2.9 (L) 05/29/2016   NEUTROABS 2.1 05/28/2016   HGB 13.2 05/29/2016   HCT 38.5 (L) 05/29/2016   MCV 97.9 05/29/2016   PLT 231 05/29/2016      Chemistry      Component Value Date/Time   NA 140 03/31/2017 1151   NA 132 (L) 02/11/2013 0627   K 4.1 03/31/2017 1151   K 3.7 02/11/2013 0627   CL 99 03/31/2017 1151   CL 100 02/11/2013 0627   CO2 25 03/31/2017 1151   CO2 23 02/11/2013 0627   BUN 26 (H) 03/31/2017 1151   BUN 24 (H) 02/11/2013 0627   CREATININE 1.56 (H) 03/31/2017 1151   CREATININE 1.15 02/21/2014 1210      Component Value Date/Time   CALCIUM 10.3 (H)  03/31/2017 1151   CALCIUM 8.7 02/21/2014 1210   ALKPHOS 62 03/31/2017 1151   ALKPHOS 86 02/21/2014 1210   AST 20 03/31/2017 1151   AST 31 02/21/2014 1210   ALT 19 03/31/2017 1151   ALT 29 02/21/2014 1210   BILITOT 0.6 03/31/2017 1151   BILITOT 0.6 02/21/2014 1210       RADIOGRAPHIC STUDIES: I have personally reviewed the radiological images as listed and agreed with the findings in the report. No results found.   ASSESSMENT & PLAN:  No problem-specific  Assessment & Plan notes found for this encounter.   No orders of the defined types were placed in this encounter.  All questions were answered. The patient knows to call the clinic with any problems, questions or concerns.      Cammie Sickle, MD 06/16/2017 8:28 AM

## 2017-06-16 NOTE — Assessment & Plan Note (Signed)
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 Recommend evaluation with Dr. Tami Ribas.  # Elevated Blood pressure- out of refills; 222/118; dizzy/ slight headaches- recommend evaluation in ER asap.   #  Former smoker- lung cancer screening; had issues with transportation. Patient interested this time; wants to talk to [facility];   # difficulty swallowing/chronic- it worse recommend evaluation with EGD.   # labs/ MD/ in 1 year. Recommend follow up with PCP re: elevated blood pressure.

## 2017-06-21 LAB — NM MYOCAR MULTI W/SPECT W/WALL MOTION / EF
CHL CUP NUCLEAR SSS: 2
CSEPEDS: 1 s
CSEPHR: 66 %
Estimated workload: 1 METS
Exercise duration (min): 1 min
LVDIAVOL: 109 mL (ref 62–150)
LVSYSVOL: 57 mL
MPHR: 162 {beats}/min
NUC STRESS TID: 0.87
Peak HR: 108 {beats}/min
Rest HR: 71 {beats}/min
SDS: 2
SRS: 1

## 2017-06-25 NOTE — Progress Notes (Signed)
[place] OFFICE PROGRESS NOTE  Patient Care Team: Birdie Sons, MD as PCP - General (Family Medicine) Yolonda Kida, MD as Consulting Physician (Cardiology) Leia Alf, MD (Inactive) as Attending Physician (Internal Medicine)  Cancer Staging No matching staging information was found for the patient.   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 Completed radiation along with concurrent weekly cisplatin chemotherapy mid-February 2013. [Dr.McQueen; Dr.Crystal]  # Poorly controlled Blood pressures- non-complaince/ CKD     Primary cancer of supraglottis ([place])   10/17/2015 Initial Diagnosis    Primary cancer of supraglottis ([place])         INTERVAL HISTORY:  Terrance Carter 57 y.o.  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  Patient continues to complain of poorly controlled blood pressures; he continues to complain of intermittent dizziness intermittent headaches.  Denies having any strokes or heart attacks recently.   He denies any significant worsening swallowing.  He does continue have complaint of dry mouth/mild difficulty swallowing.  He denies any pain neck pain. Denies any unusual or symptoms otherwise. Denies any new lumps or bumps.  Appetite is good. No weight loss. No tingling and numbness. Complains of fatigue.  REVIEW OF SYSTEMS:  A complete 10 point review of system is done which is negative except mentioned above/history of present illness.   PAST MEDICAL HISTORY :  Past Medical History:  Diagnosis Date  . Cancer (HCC) Laryngeal  . History of chicken pox   . History of measles   . History of mumps   . Hypertension   . MI, old     PAST SURGICAL HISTORY :   Past Surgical History:   Procedure Laterality Date  . 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 Dr. Tami Ribas    FAMILY HISTORY :   Family History  Problem Relation Age of Onset  . Diabetes Mother        type 2  . Hypertension Mother   . Diabetes Maternal Uncle     SOCIAL HISTORY:   Social History   Tobacco Use  . Smoking status: Former Smoker    Packs/day: 1.00    Years: 20.00    Pack years: 20.00  . Smokeless tobacco: Never Used  Substance Use Topics  . Alcohol use: Yes    Alcohol/week: 0.0 oz    Comment: Drinks wine once a month  . Drug use: No    ALLERGIES:  has No Known Allergies.  MEDICATIONS:  Current Outpatient Medications  Medication Sig Dispense Refill  . amLODipine (NORVASC) 10 MG tablet Take 1 tablet (10 mg total) by mouth daily. 30 tablet 5  . aspirin EC 81 MG tablet Take 81 mg by mouth daily.    Marland Kitchen atorvastatin (LIPITOR) 20 MG tablet Take 1 tablet (20 mg total) by mouth daily. 30 tablet 3  . fluticasone (FLONASE) 50 MCG/ACT nasal spray Place 2 sprays into both nostrils daily. (Patient not taking: Reported on 06/16/2017) 16 g 3  . losartan-hydrochlorothiazide (HYZAAR) 50-12.5 MG tablet Take 1 tablet by mouth daily. 30 tablet 11  . metoprolol succinate (TOPROL-XL) 25 MG 24 hr tablet Take 0.5 tablets (12.5 mg total) by mouth daily. 15 tablet 11  . nitroGLYCERIN (NITROSTAT) 0.4 MG SL tablet One tablet under tongue every 5 minutes as  needed for chest pain. go to ER if chest pain is not resolved after 3 doses. (Patient not taking: Reported on 06/16/2017) 30 tablet 1   No current facility-administered medications for this visit.     PHYSICAL EXAMINATION: ECOG PERFORMANCE STATUS: 0 - Asymptomatic  There were no vitals taken for this visit.  There were no vitals filed for this visit.  GENERAL: Well-nourished well-developed; Alert, no distress and comfortable.  Alone.  EYES: no pallor or icterus OROPHARYNX: no thrush or ulceration; good dentition  NECK: supple, no masses  felt LYMPH:  no palpable lymphadenopathy in the cervical, axillary or inguinal regions LUNGS: clear to auscultation and  No wheeze or crackles HEART/CVS: regular rate & rhythm and no murmurs; No lower extremity edema ABDOMEN:abdomen soft, non-tender and normal bowel sounds Musculoskeletal:no cyanosis of digits and no clubbing  PSYCH: alert & oriented x 3 with fluent speech NEURO: no focal motor/sensory deficits SKIN:  no rashes or significant lesions  LABORATORY DATA:  I have reviewed the data as listed    Component Value Date/Time   NA 136 06/16/2017 1450   NA 140 03/31/2017 1151   NA 132 (L) 02/11/2013 0627   K 4.0 06/16/2017 1450   K 3.7 02/11/2013 0627   CL 101 06/16/2017 1450   CL 100 02/11/2013 0627   CO2 26 06/16/2017 1450   CO2 23 02/11/2013 0627   GLUCOSE 117 (H) 06/16/2017 1450   GLUCOSE 163 (H) 02/11/2013 0627   BUN 38 (H) 06/16/2017 1450   BUN 26 (H) 03/31/2017 1151   BUN 24 (H) 02/11/2013 0627   CREATININE 1.62 (H) 06/16/2017 1450   CREATININE 1.15 02/21/2014 1210   CALCIUM 9.2 06/16/2017 1450   CALCIUM 8.7 02/21/2014 1210   PROT 7.4 06/16/2017 1450   PROT 7.4 03/31/2017 1151   PROT 7.6 02/21/2014 1210   ALBUMIN 4.2 06/16/2017 1450   ALBUMIN 4.7 03/31/2017 1151   ALBUMIN 3.8 02/21/2014 1210   AST 25 06/16/2017 1450   AST 31 02/21/2014 1210   ALT 18 06/16/2017 1450   ALT 29 02/21/2014 1210   ALKPHOS 57 06/16/2017 1450   ALKPHOS 86 02/21/2014 1210   BILITOT 0.6 06/16/2017 1450   BILITOT 0.6 03/31/2017 1151   BILITOT 0.6 02/21/2014 1210   GFRNONAA 45 (L) 06/16/2017 1450   GFRNONAA >60 02/21/2014 1210   GFRNONAA >60 08/14/2013 1100   GFRAA 52 (L) 06/16/2017 1450   GFRAA >60 02/21/2014 1210   GFRAA >60 08/14/2013 1100    No results found for: SPEP, UPEP  Lab Results  Component Value Date   WBC 4.1 06/16/2017   NEUTROABS 2.9 06/16/2017   HGB 12.6 (L) 06/16/2017   HCT 37.1 (L) 06/16/2017   MCV 97.5 06/16/2017   PLT 256 06/16/2017      Chemistry       Component Value Date/Time   NA 136 06/16/2017 1450   NA 140 03/31/2017 1151   NA 132 (L) 02/11/2013 0627   K 4.0 06/16/2017 1450   K 3.7 02/11/2013 0627   CL 101 06/16/2017 1450   CL 100 02/11/2013 0627   CO2 26 06/16/2017 1450   CO2 23 02/11/2013 0627   BUN 38 (H) 06/16/2017 1450   BUN 26 (H) 03/31/2017 1151   BUN 24 (H) 02/11/2013 0627   CREATININE 1.62 (H) 06/16/2017 1450   CREATININE 1.15 02/21/2014 1210      Component Value Date/Time   CALCIUM 9.2 06/16/2017 1450   CALCIUM 8.7 02/21/2014 1210   ALKPHOS  57 06/16/2017 1450   ALKPHOS 86 02/21/2014 1210   AST 25 06/16/2017 1450   AST 31 02/21/2014 1210   ALT 18 06/16/2017 1450   ALT 29 02/21/2014 1210   BILITOT 0.6 06/16/2017 1450   BILITOT 0.6 03/31/2017 1151   BILITOT 0.6 02/21/2014 1210       RADIOGRAPHIC STUDIES: I have personally reviewed the radiological images as listed and agreed with the findings in the report. No results found.   ASSESSMENT & PLAN:  Primary cancer of supraglottis ([place]) # Locally advanced glottic cancer status post induction chemotherapy followed by by definitive chemoradiation finished 2012 clinically; NED.  # Clinically no evidence of recurrence; I recommend continued follow-up/evaluation with Dr. Tami Ribas ENT.   # Elevated Blood pressure-160s/95 slight headaches.  Had a long discussion with the patient regarding importance of better controlling his blood pressures and increasing risk of-strokes heart attacks worsening peripheral vascular disease; and kidney problems.  # CKD stage III-secondary to poorly controlled blood pressure.  I suspect compliance; he agrees.  Reiterated the importance of taking his medication regular basis.  patient's creatinine 1.62./Slowly rising over the last year 2-3 years.  Again discussed at length regarding control of his blood pressures.  # labs/ MD/ in 1 year- TSH/labs. Recommend follow up with PCP re: elevated blood pressure.   Dr.Fisher   Orders  Placed This Encounter  Procedures  . CBC with Differential/Platelet    Standing Status:   Future    Standing Expiration Date:   06/17/2018  . Comprehensive metabolic panel    Standing Status:   Future    Standing Expiration Date:   06/17/2018  . TSH    Standing Status:   Future    Number of Occurrences:   1    Standing Expiration Date:   06/17/2018   All questions were answered. The patient knows to call the clinic with any problems, questions or concerns.      Cammie Sickle, MD 06/25/2017 11:48 AM

## 2017-06-30 ENCOUNTER — Encounter: Payer: Self-pay | Admitting: Family Medicine

## 2017-06-30 ENCOUNTER — Ambulatory Visit: Payer: Medicaid Other | Admitting: Family Medicine

## 2017-06-30 VITALS — BP 140/60 | HR 79 | Temp 98.2°F | Resp 16 | Ht 67.0 in | Wt 200.0 lb

## 2017-06-30 DIAGNOSIS — I519 Heart disease, unspecified: Secondary | ICD-10-CM | POA: Diagnosis not present

## 2017-06-30 DIAGNOSIS — I43 Cardiomyopathy in diseases classified elsewhere: Secondary | ICD-10-CM | POA: Diagnosis not present

## 2017-06-30 DIAGNOSIS — I119 Hypertensive heart disease without heart failure: Secondary | ICD-10-CM

## 2017-06-30 NOTE — Progress Notes (Signed)
Patient: Terrance Carter Male    DOB: 25-Jul-1960   57 y.o.   MRN: [id number] Visit Date: 06/30/2017  Today's Provider: Lelon Huh, MD   Chief Complaint  Patient presents with  . Follow-up  . Hypertension   Subjective:    HPI   Hypertension, follow-up:  BP Readings from Last 3 Encounters:  06/30/17 140/60  06/16/17 (!) 159/95  03/31/17 (!) 148/78    He was last seen for hypertension 3 months ago.  BP at that visit was 148/78. Management since that visit includes; labs checked, kidney functions declined a bit. Advised to continue current medications and drink more water.He reports good compliance with treatment. He is not having side effects. none He is not exercising. He is not adherent to low salt diet.   Outside blood pressures are not checking. He is experiencing none.  Patient denies none.   Cardiovascular risk factors include advanced age (older than 34 for men, 88 for women).  Use of agents associated with hypertension: none ----------------------------------------------------------------    Lipid/Cholesterol, Follow-up:   Last seen for this 3 months ago.  Management since that visit includes; labs checked, recommended he start atorvastatin 20 mg qd. Hehad prescription filled, but did not take due to concern about potential adverse effects.   Last Lipid Panel:    Component Value Date/Time   CHOL 175 03/31/2017 1151   TRIG 115 03/31/2017 1151   HDL 51 03/31/2017 1151   CHOLHDL 3.4 03/31/2017 1151   LDLCALC 101 (H) 03/31/2017 1151    He reports good compliance with treatment. He is having side effects. none  Wt Readings from Last 3 Encounters:  06/30/17 200 lb (90.7 kg)  06/16/17 196 lb (88.9 kg)  03/31/17 196 lb (88.9 kg)    ----------------------------------------------------------------    No Known Allergies   Current Outpatient Medications:  .  amLODipine (NORVASC) 10 MG tablet, Take 1 tablet (10 mg total) by mouth daily., Disp:  30 tablet, Rfl: 5 .  aspirin EC 81 MG tablet, Take 81 mg by mouth daily., Disp: , Rfl:  .  atorvastatin (LIPITOR) 20 MG tablet, Take 1 tablet (20 mg total) by mouth daily., Disp: 30 tablet, Rfl: 3 .  fluticasone (FLONASE) 50 MCG/ACT nasal spray, Place 2 sprays into both nostrils daily., Disp: 16 g, Rfl: 3 .  losartan-hydrochlorothiazide (HYZAAR) 50-12.5 MG tablet, Take 1 tablet by mouth daily., Disp: 30 tablet, Rfl: 11 .  metoprolol succinate (TOPROL-XL) 25 MG 24 hr tablet, Take 0.5 tablets (12.5 mg total) by mouth daily., Disp: 15 tablet, Rfl: 11 .  nitroGLYCERIN (NITROSTAT) 0.4 MG SL tablet, One tablet under tongue every 5 minutes as needed for chest pain. go to ER if chest pain is not resolved after 3 doses., Disp: 30 tablet, Rfl: 1  Review of Systems  Constitutional: Negative for appetite change, chills and fever.  Respiratory: Negative for chest tightness, shortness of breath and wheezing.   Cardiovascular: Negative for chest pain and palpitations.  Gastrointestinal: Negative for abdominal pain, nausea and vomiting.    Social History   Tobacco Use  . Smoking status: Former Smoker    Packs/day: 1.00    Years: 20.00    Pack years: 20.00  . Smokeless tobacco: Never Used  Substance Use Topics  . Alcohol use: Yes    Alcohol/week: 0.0 oz    Comment: Drinks wine once a month   Objective:   BP 140/60 (BP Location: Right Arm, Patient Position: Sitting, Cuff Size: Large)  Pulse 79   Temp 98.2 F (36.8 C) (Oral)   Resp 16   Ht 5\' 7"  (1.702 m)   Wt 200 lb (90.7 kg)   SpO2 98%   BMI 31.32 kg/m  Vitals:   06/30/17 1109  BP: 140/60  Pulse: 79  Resp: 16  Temp: 98.2 F (36.8 C)  TempSrc: Oral  SpO2: 98%  Weight: 200 lb (90.7 kg)  Height: 5\' 7"  (1.702 m)     Physical Exam   General Appearance:    Alert, cooperative, no distress  Eyes:    PERRL, conjunctiva/corneas clear, EOM's intact       Lungs:     Clear to auscultation bilaterally, respirations unlabored  Heart:     Regular rate and rhythm  Neurologic:   Awake, alert, oriented x 3. No apparent focal neurological           defect.           Assessment & Plan:     1. 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 Continue regular follow up Dr. Clayborn Bigness  2. Heart disease He recently had low risk Myoview stress test, but MI history is not clear. Considering overall risk of CAD recommend he go ahead and start atorvastatin the was previously prescribed and check lipids in about 2 months.       Lelon Huh, MD  [facility]

## 2017-06-30 NOTE — Patient Instructions (Signed)
   Go ahead and start taking atorvastatin daily. We will plan on recheck your cholesterol in mid June

## 2017-07-13 ENCOUNTER — Other Ambulatory Visit: Payer: Self-pay | Admitting: Family Medicine

## 2017-07-13 DIAGNOSIS — I10 Essential (primary) hypertension: Secondary | ICD-10-CM

## 2017-07-13 MED ORDER — LOSARTAN POTASSIUM-HCTZ 50-12.5 MG PO TABS: 1.0000 | ORAL_TABLET | Freq: Every day | ORAL | 11 refills | Status: DC

## 2017-07-13 MED ORDER — METOPROLOL SUCCINATE ER 25 MG PO TB24: 12.5000 mg | ORAL_TABLET | Freq: Every day | ORAL | 11 refills | Status: DC

## 2017-07-13 MED ORDER — AMLODIPINE BESYLATE 10 MG PO TABS: 10.0000 mg | ORAL_TABLET | Freq: Every day | ORAL | 5 refills | Status: DC

## 2017-07-13 NOTE — Telephone Encounter (Signed)
Pt needs refills  Metoprolol 25mg   Amlodopine  10mg   Losartan 50-12.5  [facility]  [address]

## 2017-07-14 ENCOUNTER — Ambulatory Visit: Payer: Medicaid Other | Admitting: Internal Medicine

## 2017-08-30 ENCOUNTER — Telehealth: Payer: Self-pay | Admitting: Family Medicine

## 2017-08-30 DIAGNOSIS — E785 Hyperlipidemia, unspecified: Secondary | ICD-10-CM

## 2017-08-30 NOTE — Telephone Encounter (Signed)
Patient was advised and lab was printed.

## 2017-08-30 NOTE — Telephone Encounter (Signed)
Please advise patient it is time to check lipids as he is supposed to be taking atorvastatin every day now. Needs to be fasting. Please print pended order and leave at lab for patient.

## 2017-09-22 ENCOUNTER — Other Ambulatory Visit: Payer: Self-pay

## 2017-09-22 DIAGNOSIS — I10 Essential (primary) hypertension: Secondary | ICD-10-CM

## 2017-09-22 NOTE — Telephone Encounter (Signed)
Patient called office requesting refill on Amlodipine and Metoprolol, patient states that he is about to be out of both of these medications. Also patient states that he received a letter in the mail telling him that Losartan has been recalled, patient would like medications sent to [facility] on [address] and request call back when this is filled. KW

## 2017-09-23 MED ORDER — VALSARTAN-HYDROCHLOROTHIAZIDE 80-12.5 MG PO TABS: 1.0000 | ORAL_TABLET | Freq: Every day | ORAL | 5 refills | Status: DC

## 2017-09-23 NOTE — Telephone Encounter (Signed)
Pt called for a status update on his refill request for amLODipine (NORVASC) 10 MG tablet & metoprolol succinate (TOPROL-XL) 25 MG 24 hr tablet. Pt was advised of the Rx that was sent to [facility] on 07/13/17 with refills and that he shouldn't be due for refills at this time. Pt stated that he will contact the pharmacy.  Pt is also requesting an update about what he needs to do about the recall on Losartan. Please advise. Thanks TNP

## 2017-09-23 NOTE — Telephone Encounter (Signed)
na

## 2017-09-24 NOTE — Telephone Encounter (Signed)
Pt advised.   Thanks,   -Laura  

## 2017-11-02 ENCOUNTER — Ambulatory Visit: Payer: Self-pay | Admitting: Family Medicine

## 2018-06-17 ENCOUNTER — Inpatient Hospital Stay: Payer: Medicaid Other | Attending: Internal Medicine

## 2018-06-17 ENCOUNTER — Telehealth: Payer: Self-pay | Admitting: Hematology and Oncology

## 2018-06-17 ENCOUNTER — Ambulatory Visit: Payer: Medicaid Other | Admitting: Internal Medicine

## 2018-06-20 ENCOUNTER — Inpatient Hospital Stay: Payer: Medicaid Other | Admitting: Internal Medicine

## 2019-01-25 ENCOUNTER — Emergency Department: Payer: Medicaid Other

## 2019-01-25 ENCOUNTER — Other Ambulatory Visit: Payer: Self-pay

## 2019-01-25 ENCOUNTER — Inpatient Hospital Stay
Admission: EM | Admit: 2019-01-25 | Discharge: 2019-01-26 | DRG: 305 | Discharge status: Against Medical Advice | Payer: Medicaid Other | Attending: Internal Medicine | Admitting: Internal Medicine

## 2019-01-25 DIAGNOSIS — Z8589 Personal history of malignant neoplasm of other organs and systems: Secondary | ICD-10-CM

## 2019-01-25 DIAGNOSIS — R040 Epistaxis: Secondary | ICD-10-CM | POA: Diagnosis not present

## 2019-01-25 DIAGNOSIS — Z8619 Personal history of other infectious and parasitic diseases: Secondary | ICD-10-CM

## 2019-01-25 DIAGNOSIS — Z20828 Contact with and (suspected) exposure to other viral communicable diseases: Secondary | ICD-10-CM | POA: Diagnosis not present

## 2019-01-25 DIAGNOSIS — I428 Other cardiomyopathies: Secondary | ICD-10-CM | POA: Diagnosis present

## 2019-01-25 DIAGNOSIS — Z7951 Long term (current) use of inhaled steroids: Secondary | ICD-10-CM

## 2019-01-25 DIAGNOSIS — I2510 Atherosclerotic heart disease of native coronary artery without angina pectoris: Secondary | ICD-10-CM | POA: Diagnosis present

## 2019-01-25 DIAGNOSIS — Z5329 Procedure and treatment not carried out because of patient's decision for other reasons: Secondary | ICD-10-CM | POA: Diagnosis present

## 2019-01-25 DIAGNOSIS — I10 Essential (primary) hypertension: Secondary | ICD-10-CM | POA: Diagnosis not present

## 2019-01-25 DIAGNOSIS — Z833 Family history of diabetes mellitus: Secondary | ICD-10-CM

## 2019-01-25 DIAGNOSIS — E785 Hyperlipidemia, unspecified: Secondary | ICD-10-CM

## 2019-01-25 DIAGNOSIS — I161 Hypertensive emergency: Principal | ICD-10-CM | POA: Diagnosis present

## 2019-01-25 DIAGNOSIS — I252 Old myocardial infarction: Secondary | ICD-10-CM

## 2019-01-25 DIAGNOSIS — R7989 Other specified abnormal findings of blood chemistry: Secondary | ICD-10-CM | POA: Diagnosis not present

## 2019-01-25 DIAGNOSIS — I248 Other forms of acute ischemic heart disease: Secondary | ICD-10-CM | POA: Diagnosis present

## 2019-01-25 DIAGNOSIS — Z7982 Long term (current) use of aspirin: Secondary | ICD-10-CM

## 2019-01-25 DIAGNOSIS — R778 Other specified abnormalities of plasma proteins: Secondary | ICD-10-CM | POA: Diagnosis present

## 2019-01-25 DIAGNOSIS — Z8249 Family history of ischemic heart disease and other diseases of the circulatory system: Secondary | ICD-10-CM

## 2019-01-25 DIAGNOSIS — Z9114 Patient's other noncompliance with medication regimen: Secondary | ICD-10-CM

## 2019-01-25 DIAGNOSIS — Z79899 Other long term (current) drug therapy: Secondary | ICD-10-CM

## 2019-01-25 DIAGNOSIS — R0602 Shortness of breath: Secondary | ICD-10-CM | POA: Diagnosis not present

## 2019-01-25 DIAGNOSIS — I160 Hypertensive urgency: Secondary | ICD-10-CM | POA: Diagnosis not present

## 2019-01-25 DIAGNOSIS — Z87891 Personal history of nicotine dependence: Secondary | ICD-10-CM

## 2019-01-25 LAB — COMPREHENSIVE METABOLIC PANEL
ALT: 20 U/L (ref 0–44)
AST: 24 U/L (ref 15–41)
Albumin: 4.7 g/dL (ref 3.5–5.0)
Alkaline Phosphatase: 65 U/L (ref 38–126)
Anion gap: 10 (ref 5–15)
BUN: 15 mg/dL (ref 6–20)
CO2: 27 mmol/L (ref 22–32)
Calcium: 9.3 mg/dL (ref 8.9–10.3)
Chloride: 101 mmol/L (ref 98–111)
Creatinine, Ser: 1.21 mg/dL (ref 0.61–1.24)
GFR calc Af Amer: >60 mL/min (ref >60)
GFR calc non Af Amer: >60 mL/min (ref >60)
Glucose, Bld: 120 mg/dL — ABNORMAL HIGH (ref 70–99)
Potassium: 3.6 mmol/L (ref 3.5–5.1)
Sodium: 138 mmol/L (ref 135–145)
Total Bilirubin: 0.7 mg/dL (ref 0.3–1.2)
Total Protein: 8.2 g/dL — ABNORMAL HIGH (ref 6.5–8.1)

## 2019-01-25 LAB — CBC
HCT: 42 % (ref 39.0–52.0)
Hemoglobin: 13.9 g/dL (ref 13.0–17.0)
MCH: 32.1 pg (ref 26.0–34.0)
MCHC: 33.1 g/dL (ref 30.0–36.0)
MCV: 97 fL (ref 80.0–100.0)
Platelets: 244 10*3/uL (ref 150–400)
RBC: 4.33 MIL/uL (ref 4.22–5.81)
RDW: 12.8 % (ref 11.5–15.5)
WBC: 4.3 10*3/uL (ref 4.0–10.5)
nRBC: 0 % (ref 0.0–0.2)

## 2019-01-25 LAB — TROPONIN I (HIGH SENSITIVITY): Troponin I (High Sensitivity): 83 ng/L — ABNORMAL HIGH (ref <18)

## 2019-01-25 MED ORDER — VALSARTAN-HYDROCHLOROTHIAZIDE 80-12.5 MG PO TABS: 1.0000 | ORAL_TABLET | Freq: Every day | ORAL | Status: DC

## 2019-01-25 MED ORDER — NITROGLYCERIN 0.4 MG SL SUBL: 0.4000 mg | SUBLINGUAL_TABLET | SUBLINGUAL | Status: DC | PRN

## 2019-01-25 MED ORDER — OXYMETAZOLINE HCL 0.05 % NA SOLN
1.0000 | Freq: Once | NASAL | Status: AC
  Administered 2019-01-25: 21:00:00 1 via NASAL
  Filled 2019-01-25: qty 30
  Filled 2019-01-25: qty 15

## 2019-01-25 MED ORDER — ACETAMINOPHEN 325 MG PO TABS: 650.0000 mg | ORAL_TABLET | Freq: Four times a day (QID) | ORAL | Status: DC | PRN

## 2019-01-25 MED ORDER — ONDANSETRON HCL 4 MG/2ML IJ SOLN: 4.0000 mg | Freq: Four times a day (QID) | INTRAMUSCULAR | Status: DC | PRN

## 2019-01-25 MED ORDER — ATORVASTATIN CALCIUM 20 MG PO TABS: 20.0000 mg | ORAL_TABLET | Freq: Every day | ORAL | Status: DC

## 2019-01-25 MED ORDER — FLUTICASONE PROPIONATE 50 MCG/ACT NA SUSP
2.0000 | Freq: Every day | NASAL | Status: DC
  Administered 2019-01-26: 10:00:00 2 via NASAL
  Filled 2019-01-25: qty 16

## 2019-01-25 MED ORDER — MAGNESIUM HYDROXIDE 400 MG/5ML PO SUSP: 30.0000 mL | Freq: Every day | ORAL | Status: DC | PRN

## 2019-01-25 MED ORDER — ACETAMINOPHEN 650 MG RE SUPP: 650.0000 mg | Freq: Four times a day (QID) | RECTAL | Status: DC | PRN

## 2019-01-25 MED ORDER — POTASSIUM CHLORIDE 20 MEQ PO PACK
40.0000 meq | PACK | Freq: Once | ORAL | Status: AC
  Administered 2019-01-26: 40 meq via ORAL
  Filled 2019-01-25: qty 2

## 2019-01-25 MED ORDER — METOPROLOL SUCCINATE ER 25 MG PO TB24: 12.5000 mg | ORAL_TABLET | Freq: Every day | ORAL | Status: DC

## 2019-01-25 MED ORDER — ENOXAPARIN SODIUM 40 MG/0.4ML ~~LOC~~ SOLN
40.0000 mg | SUBCUTANEOUS | Status: DC
  Filled 2019-01-25 (×2): qty 0.4

## 2019-01-25 MED ORDER — METOPROLOL TARTRATE 12.5 MG HALF TABLET
12.5000 mg | ORAL_TABLET | Freq: Once | ORAL | Status: AC
  Administered 2019-01-25: 20:00:00 12.5 mg via ORAL
  Filled 2019-01-25: qty 1

## 2019-01-25 MED ORDER — SODIUM CHLORIDE 0.9% FLUSH: 3.0000 mL | INTRAVENOUS | Status: DC | PRN

## 2019-01-25 MED ORDER — AMLODIPINE BESYLATE 5 MG PO TABS
10.0000 mg | ORAL_TABLET | Freq: Every day | ORAL | Status: DC
  Administered 2019-01-26: 10:00:00 10 mg via ORAL
  Filled 2019-01-25: qty 2

## 2019-01-25 MED ORDER — TRAZODONE HCL 50 MG PO TABS
25.0000 mg | ORAL_TABLET | Freq: Every evening | ORAL | Status: DC | PRN
  Filled 2019-01-25: qty 0.5

## 2019-01-25 MED ORDER — SODIUM CHLORIDE 0.9 % IV SOLN: 250.0000 mL | INTRAVENOUS | Status: DC | PRN

## 2019-01-25 MED ORDER — ONDANSETRON HCL 4 MG PO TABS
4.0000 mg | ORAL_TABLET | Freq: Four times a day (QID) | ORAL | Status: DC | PRN
  Filled 2019-01-25: qty 1

## 2019-01-25 MED ORDER — SODIUM CHLORIDE 0.9% FLUSH
3.0000 mL | Freq: Two times a day (BID) | INTRAVENOUS | Status: DC
  Administered 2019-01-26: 3 mL via INTRAVENOUS

## 2019-01-25 MED ORDER — LABETALOL HCL 5 MG/ML IV SOLN
20.0000 mg | INTRAVENOUS | Status: DC | PRN
  Administered 2019-01-26: 20 mg via INTRAVENOUS
  Filled 2019-01-25: qty 4

## 2019-01-25 MED ORDER — AMLODIPINE BESYLATE 5 MG PO TABS
10.0000 mg | ORAL_TABLET | Freq: Once | ORAL | Status: AC
  Administered 2019-01-25: 20:00:00 10 mg via ORAL
  Filled 2019-01-25: qty 2

## 2019-01-25 NOTE — ED Triage Notes (Signed)
Pt arrives to ED via POV from home with c/o epistaxis since last night. Pt reports bleeding from left nare; no h/x of same. Pt reports h/x of HTN; takes medications as directed. Pt denies headache, no CP or SHOB; no N/V/D or fever. Pt reports bleeding has been intermittent; stopped 30-45 mins PTA; used r/x'd nasal spray with relief. Pt is A&O, in NAD; RR even, regular, and unlabored.

## 2019-01-25 NOTE — ED Provider Notes (Signed)
[facility] Emergency Department Provider Note ____________________________________________   First MD Initiated Contact with Patient 01/25/19 1953     (approximate)  I have reviewed the triage vital signs and the nursing notes.   HISTORY  Chief Complaint Epistaxis    HPI Terrance Carter is a 58 y.o. male with PMH as noted below including a history of hypertension (patient states that he is noncompliant with his blood pressure medications) who presents with epistaxis since yesterday, acute onset, intermittent course since then, and now stopped.  The patient states that this has happened before when his blood pressure is up.  He reports some intermittent shortness of breath recently but denies chest pain.  He states he has not taken his blood pressure medications for at least several months.  He denies any other abnormal bleeding or bruising.  Past Medical History:  Diagnosis Date  . Cancer (HCC) Laryngeal  . History of chicken pox   . History of measles   . History of mumps   . Hypertension   . MI, old     Patient Active Problem List   Diagnosis Date Noted  . Hypertensive cardiomyopathy ([place]) 03/26/2017  . 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 Change in vision 11/23/2014  . Dysphagia 11/23/2014  . Heart disease 11/23/2014  . Hidradenitis suppurativa 11/23/2014  . 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 Keloid 11/23/2014  . 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 Dr. Tami Ribas    Prior to Admission medications   Medication Sig Start Date End Date Taking? Authorizing Provider  amLODipine (NORVASC) 10 MG tablet Take 1 tablet (10 mg total) by mouth daily. 07/13/17   Birdie Sons, MD  aspirin EC 81 MG tablet Take 81 mg by mouth daily.    [provider]  atorvastatin (LIPITOR) 20 MG tablet  Take 1 tablet (20 mg total) by mouth daily. 04/01/17   Birdie Sons, MD  fluticasone (FLONASE) 50 MCG/ACT nasal spray Place 2 sprays into both nostrils daily. 09/24/14   Birdie Sons, MD  metoprolol succinate (TOPROL-XL) 25 MG 24 hr tablet Take 0.5 tablets (12.5 mg total) by mouth daily. 07/13/17 07/13/18  Birdie Sons, MD  nitroGLYCERIN (NITROSTAT) 0.4 MG SL tablet One tablet under tongue every 5 minutes as needed for chest pain. go to ER if chest pain is not resolved after 3 doses. 03/26/17   Birdie Sons, MD  valsartan-hydrochlorothiazide (DIOVAN-HCT) 80-12.5 MG tablet Take 1 tablet by mouth daily. 09/23/17   Birdie Sons, MD    Allergies Patient has no known allergies.  Family History  Problem Relation Age of Onset  . Diabetes Mother        type 2  . Hypertension Mother   . Diabetes Maternal Uncle     Social History Social History   Tobacco Use  . Smoking status: Former Smoker    Packs/day: 1.00    Years: 20.00    Pack years: 20.00  . Smokeless tobacco: Never Used  Substance Use Topics  . Alcohol use: Yes    Alcohol/week: 0.0 standard drinks    Comment: Drinks wine once a month  . Drug use: No    Review of Systems  Constitutional: No fever. Eyes: No visual changes. ENT: Positive for epistaxis. Cardiovascular: Denies chest pain. Respiratory: Positive for intermittent shortness  of breath. Gastrointestinal: No vomiting. Genitourinary: Negative for flank pain.  Musculoskeletal: Negative for back pain. Skin: Negative for rash. Neurological: Negative for headache.   ____________________________________________   PHYSICAL EXAM:  VITAL SIGNS: ED Triage Vitals  Enc Vitals Group     BP 01/25/19 1919 (!) 231/124     Pulse Rate 01/25/19 1919 (!) 109     Resp 01/25/19 1919 16     Temp 01/25/19 1919 98.2 F (36.8 C)     Temp Source 01/25/19 1919 Oral     SpO2 01/25/19 1919 98 %     Weight 01/25/19 1916 215 lb (97.5 kg)     Height 01/25/19 1916 5\' 8"   (1.727 m)     Head Circumference --      Peak Flow --      Pain Score 01/25/19 1916 0     Pain Loc --      Pain Edu? --      Excl. in [place]? --     Constitutional: Alert and oriented.  Relatively well appearing and in no acute distress. Eyes: Conjunctivae are normal.  Head: Atraumatic. Nose: No congestion/rhinnorhea.  Evidence of recent bleeding in the anterior left nasal passage with no active epistaxis. Mouth/Throat: Mucous membranes are moist.  Oropharynx clear. Neck: Normal range of motion.  Cardiovascular: Normal rate, regular rhythm. Good peripheral circulation. Respiratory: Normal respiratory effort.  No retractions.  Gastrointestinal: No distention.  Musculoskeletal: No lower extremity edema.  Extremities warm and well perfused.  Neurologic:  Normal speech and language. No gross focal neurologic deficits are appreciated.  Skin:  Skin is warm and dry. No rash noted. Psychiatric: Mood and affect are normal. Speech and behavior are normal.  ____________________________________________   LABS (all labs ordered are listed, but only abnormal results are displayed)  Labs Reviewed  COMPREHENSIVE METABOLIC PANEL - Abnormal; Notable for the following components:      Result Value   Glucose, Bld 120 (*)    Total Protein 8.2 (*)    All other components within normal limits  TROPONIN I (HIGH SENSITIVITY) - Abnormal; Notable for the following components:   Troponin I (High Sensitivity) 83 (*)    All other components within normal limits  SARS CORONAVIRUS 2 (TAT 6-24 HRS)  CBC   ____________________________________________  EKG  ED ECG REPORT I, Arta Silence, the attending physician, personally viewed and interpreted this ECG.  Date: 01/25/2019 EKG Time: 2025 Rate: 84 Rhythm: normal sinus rhythm QRS Axis: normal Intervals: normal ST/T Wave abnormalities: Nonspecific lateral T wave abnormalities Narrative Interpretation: Nonspecific T wave abnormalities with no  evidence of acute ischemia; no significant change when compared to EKG of 03/26/2017  ____________________________________________  RADIOLOGY  CXR: No focal infiltrate or other acute abnormality  ____________________________________________   PROCEDURES  Procedure(s) performed: No  Procedures  Critical Care performed: Yes  CRITICAL CARE Performed by: Arta Silence   Total critical care time: 20 minutes  Critical care time was exclusive of separately billable procedures and treating other patients.  Critical care was necessary to treat or prevent imminent or life-threatening deterioration.  Critical care was time spent personally by me on the following activities: development of treatment plan with patient and/or surrogate as well as nursing, discussions with consultants, evaluation of patient's response to treatment, examination of patient, obtaining history from patient or surrogate, ordering and performing treatments and interventions, ordering and review of laboratory studies, ordering and review of radiographic studies, pulse oximetry and re-evaluation of patient's condition. ____________________________________________   INITIAL  IMPRESSION / ASSESSMENT AND PLAN / ED COURSE  Pertinent labs & imaging results that were available during my care of the patient were reviewed by me and considered in my medical decision making (see chart for details).  58 year old male with a history of hypertension presents with epistaxis since last night which has now spontaneously resolved.  He feels like his blood pressure is up.  He reports some intermittent shortness of breath but denies other acute symptoms.  He states he has not been compliant with his blood pressure medications for months.  On exam, the patient is relatively well-appearing.  He is significantly hypertensive and slightly tachycardic.  His other vital signs are normal.  He has evidence of recent anterior epistaxis but  no active bleeding.  The remainder of the exam is unremarkable.  The epistaxis is consistent with an anterior source.  I will give Afrin but as it has resolved, there is no indication for packing or further intervention.  However, I am concerned about the patient's elevated blood pressure which is likely due to chronic medication noncompliance.  He was ordered metoprolol and amlodipine while in triage.  We have placed the patient on a cardiac monitor and will watch for response to these medications.  I will give IV antihypertensives if needed.  Initial lab work-up revealed slightly elevated troponin.  Given the very high blood pressure and the troponin, I will plan to admit the patient.  ----------------------------------------- 8:49 PM on 01/25/2019 -----------------------------------------  EKG shows no ischemic changes and is identical to prior EKG from 2019.  I discussed the patient with the hospitalist Dr. Sidney Ace for admission.  ____________________________  Nyra Capes was evaluated in Emergency Department on 01/25/2019 for the symptoms described in the history of present illness. He was evaluated in the context of the global COVID-19 pandemic, which necessitated consideration that the patient might be at risk for infection with the SARS-CoV-2 virus that causes COVID-19. Institutional protocols and algorithms that pertain to the evaluation of patients at risk for COVID-19 are in a state of rapid change based on information released by regulatory bodies including the CDC and federal and state organizations. These policies and algorithms were followed during the patient's care in the ED.  ____________________________________________   FINAL CLINICAL IMPRESSION(S) / ED DIAGNOSES  Final diagnoses:  Hypertension, unspecified type  Elevated troponin  Anterior epistaxis      NEW MEDICATIONS STARTED DURING THIS VISIT:  New Prescriptions   No medications on file     Note:  This  document was prepared using Dragon voice recognition software and may include unintentional dictation errors.    Arta Silence, MD 01/25/19 2050

## 2019-01-25 NOTE — H&P (Signed)
[facility] [place] NAME: Terrance Carter    MR#:  [id number]  DATE OF BIRTH:  1961/03/15  DATE OF ADMISSION:  01/25/2019  PRIMARY CARE PHYSICIAN: Birdie Sons, MD   REQUESTING/REFERRING PHYSICIAN: Arta Silence, MD  CHIEF COMPLAINT:   Chief Complaint  Patient presents with  . Epistaxis    HISTORY OF PRESENT ILLNESS:  Terrance Carter  is a 58 y.o. African-American male with a known history of hypertension and coronary artery disease, who presented to emergency room with acute onset of epistaxis that started today while he was at home with associated mild headache and dizziness.  He denied any blurred vision, diplopia, paresthesias or focal muscle weakness.  He denied any presyncope or syncope.  No cough or wheezing or dyspnea or palpitations or chest pain.  No fever or chills.  He denied any recent sick exposures.  No other bleeding diathesis.  Upon presentation to the emergency room, initial blood pressure was 231/124 with a pulse of 109 with otherwise normal vital signs.  Labs revealed high-sensitivity troponin I of 83.  His COVID-19 test came back negative and portable chest ray showed no acute cardiopulmonary disease showed normal sinus rhythm with rate of 84 with early repolarization anteroseptally and prolonged QT interval with QTC of 482 MS.  The patient was given Norvasc 10 mg p.o. as well as Lopressor 12.5 mg p.o. and nasal Afrin spray.  His epistaxis has already resolved before he received Afrin.  He will be placed in observation on telemetry for further evaluation and management of his hypertensive urgency  PAST MEDICAL HISTORY:   Past Medical History:  Diagnosis Date  . Cancer (HCC) Laryngeal  . History of chicken pox   . History of measles   . History of mumps   . Hypertension   . MI, old     PAST SURGICAL HISTORY:   Past Surgical History:  Procedure Laterality Date  . 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 Dr.  Tami Ribas    SOCIAL HISTORY:   Social History   Tobacco Use  . Smoking status: Former Smoker    Packs/day: 1.00    Years: 20.00    Pack years: 20.00  . Smokeless tobacco: Never Used  Substance Use Topics  . Alcohol use: Yes    Alcohol/week: 0.0 standard drinks    Comment: Drinks wine once a month    FAMILY HISTORY:   Family History  Problem Relation Age of Onset  . Diabetes Mother        type 2  . Hypertension Mother   . Diabetes Maternal Uncle     DRUG ALLERGIES:  No Known Allergies  REVIEW OF SYSTEMS:   ROS As per history of present illness. All pertinent systems were reviewed above. Constitutional,  HEENT, cardiovascular, respiratory, GI, GU, musculoskeletal, neuro, psychiatric, endocrine,  integumentary and hematologic systems were reviewed and are otherwise  negative/unremarkable except for positive findings mentioned above in the HPI.   MEDICATIONS AT HOME:   Prior to Admission medications   Medication Sig Start Date End Date Taking? Authorizing Provider  amLODipine (NORVASC) 10 MG tablet Take 1 tablet (10 mg total) by mouth daily. 07/13/17   Birdie Sons, MD  aspirin EC 81 MG tablet Take 81 mg by mouth daily.    [provider]  atorvastatin (LIPITOR) 20 MG tablet Take 1 tablet (20 mg total) by mouth daily. 04/01/17   Birdie Sons, MD  fluticasone Asencion Islam)  50 MCG/ACT nasal spray Place 2 sprays into both nostrils daily. 09/24/14   Birdie Sons, MD  metoprolol succinate (TOPROL-XL) 25 MG 24 hr tablet Take 0.5 tablets (12.5 mg total) by mouth daily. 07/13/17 07/13/18  Birdie Sons, MD  nitroGLYCERIN (NITROSTAT) 0.4 MG SL tablet One tablet under tongue every 5 minutes as needed for chest pain. go to ER if chest pain is not resolved after 3 doses. 03/26/17   Birdie Sons, MD  valsartan-hydrochlorothiazide (DIOVAN-HCT) 80-12.5 MG tablet Take 1 tablet by mouth daily. 09/23/17   Birdie Sons, MD      VITAL SIGNS:  Blood pressure (!)  203/119, pulse (!) 103, temperature 98.2 F (36.8 C), temperature source Oral, resp. rate 18, height 5\' 8"  (1.727 m), weight 97.5 kg, SpO2 97 %.  PHYSICAL EXAMINATION:  Physical Exam  GENERAL:  58 y.o.-year-old African-American patient lying in the bed with no acute distress.  EYES: Pupils equal, round, reactive to light and accommodation. No scleral icterus. Extraocular muscles intact.  HEENT: Head atraumatic, normocephalic. Oropharynx and nasopharynx clear.  NECK:  Supple, no jugular venous distention. No thyroid enlargement, no tenderness.  LUNGS: Normal breath sounds bilaterally, no wheezing, rales,rhonchi or crepitation. No use of accessory muscles of respiration.  CARDIOVASCULAR: Regular rate and rhythm, S1, S2 normal. No murmurs, rubs, or gallops.  ABDOMEN: Soft, nondistended, nontender. Bowel sounds present. No organomegaly or mass.  EXTREMITIES: No pedal edema, cyanosis, or clubbing.  NEUROLOGIC: Cranial nerves II through XII are intact. Muscle strength 5/5 in all extremities. Sensation intact. Gait not checked.  PSYCHIATRIC: The patient is alert and oriented x 3.  Normal affect and good eye contact. SKIN: No obvious rash, lesion, or ulcer.   LABORATORY PANEL:   CBC Recent Labs  Lab 01/25/19 1929  WBC 4.3  HGB 13.9  HCT 42.0  PLT 244   ------------------------------------------------------------------------------------------------------------------  Chemistries  Recent Labs  Lab 01/25/19 1929  NA 138  K 3.6  CL 101  CO2 27  GLUCOSE 120*  BUN 15  CREATININE 1.21  CALCIUM 9.3  AST 24  ALT 20  ALKPHOS 65  BILITOT 0.7   ------------------------------------------------------------------------------------------------------------------  Cardiac Enzymes No results for input(s): TROPONINI in the last 168 hours. ------------------------------------------------------------------------------------------------------------------  RADIOLOGY:  Dg Chest Portable 1 View   Result Date: 01/25/2019 CLINICAL DATA:  Shortness of breath and elevated troponin EXAM: PORTABLE CHEST 1 VIEW COMPARISON:  02/11/2011 FINDINGS: Cardiac shadow is at the upper limits of normal in size. The lungs are clear bilaterally. No bony abnormality is seen. IMPRESSION: No acute abnormality noted. Electronically Signed   By: Inez Catalina M.D.   On: 01/25/2019 20:43      IMPRESSION AND PLAN:   1.  Hypertensive urgency likely secondary to noncommitment to medical therapy.  The patient will be admitted to an observation telemetry bed.  He will be continued on his antihypertensives including Norvasc, Toprol-XL and Diovan HCT and placed on as needed IV labetalol.  2.  Anterior epistaxis.  This is clearly secondary to #1. THe patient was given Afrin nasal spray in the ER.  He has been having good hemostasis from before Afrin.  We will continue monitoring for further epistaxis.  3.  Elevated troponin I.  Will follow more troponin levels.  This likely secondary to his elevated blood pressure.  Will obtain a 2D echo for further assessment and a cardiology consultation. I notified Dr. Clayborn Bigness about the patient  4.  Dyslipidemia.  Statin therapy will be resumed.  5.  Coronary artery disease.  We will continue prn SL nitroglycerin and beta-blocker therapy as well as Diovan HCT.  6.  DVT prophylaxis.  SCDs.  We will hold off on medical prophylaxis given his epistaxis.   All the records are reviewed and case discussed with ED provider. The plan of care was discussed in details with the patient (and family). I answered all questions. The patient agreed to proceed with the above mentioned plan. Further management will depend upon hospital course.   CODE STATUS: Full code  TOTAL TIME TAKING CARE OF THIS PATIENT: 55 minutes.    Christel Mormon M.D on 01/25/2019 at 10:56 PM  [facility]   From 7 PM-7 AM, contact night-coverage [email or web address]  CC: Primary care physician; Birdie Sons,  MD   Note: This dictation was prepared with Dragon dictation along with smaller phrase technology. Any transcriptional errors that result from this process are unintentional.

## 2019-01-26 ENCOUNTER — Observation Stay
Admit: 2019-01-26 | Discharge: 2019-01-26 | Disposition: A | Discharge status: Home / Self Care | Payer: Medicaid Other | Attending: Family Medicine | Admitting: Family Medicine

## 2019-01-26 ENCOUNTER — Other Ambulatory Visit: Payer: Self-pay

## 2019-01-26 DIAGNOSIS — I160 Hypertensive urgency: Secondary | ICD-10-CM | POA: Diagnosis not present

## 2019-01-26 DIAGNOSIS — I248 Other forms of acute ischemic heart disease: Secondary | ICD-10-CM | POA: Diagnosis present

## 2019-01-26 DIAGNOSIS — Z7951 Long term (current) use of inhaled steroids: Secondary | ICD-10-CM | POA: Diagnosis not present

## 2019-01-26 DIAGNOSIS — E785 Hyperlipidemia, unspecified: Secondary | ICD-10-CM

## 2019-01-26 DIAGNOSIS — Z833 Family history of diabetes mellitus: Secondary | ICD-10-CM | POA: Diagnosis not present

## 2019-01-26 DIAGNOSIS — R7989 Other specified abnormal findings of blood chemistry: Secondary | ICD-10-CM | POA: Diagnosis not present

## 2019-01-26 DIAGNOSIS — Z8589 Personal history of malignant neoplasm of other organs and systems: Secondary | ICD-10-CM | POA: Diagnosis not present

## 2019-01-26 DIAGNOSIS — Z8249 Family history of ischemic heart disease and other diseases of the circulatory system: Secondary | ICD-10-CM | POA: Diagnosis not present

## 2019-01-26 DIAGNOSIS — Z79899 Other long term (current) drug therapy: Secondary | ICD-10-CM | POA: Diagnosis not present

## 2019-01-26 DIAGNOSIS — Z8619 Personal history of other infectious and parasitic diseases: Secondary | ICD-10-CM | POA: Diagnosis not present

## 2019-01-26 DIAGNOSIS — I10 Essential (primary) hypertension: Secondary | ICD-10-CM | POA: Diagnosis not present

## 2019-01-26 DIAGNOSIS — I252 Old myocardial infarction: Secondary | ICD-10-CM | POA: Diagnosis not present

## 2019-01-26 DIAGNOSIS — Z20828 Contact with and (suspected) exposure to other viral communicable diseases: Secondary | ICD-10-CM | POA: Diagnosis not present

## 2019-01-26 DIAGNOSIS — R778 Other specified abnormalities of plasma proteins: Secondary | ICD-10-CM

## 2019-01-26 DIAGNOSIS — Z9114 Patient's other noncompliance with medication regimen: Secondary | ICD-10-CM | POA: Diagnosis not present

## 2019-01-26 DIAGNOSIS — R079 Chest pain, unspecified: Secondary | ICD-10-CM | POA: Diagnosis not present

## 2019-01-26 DIAGNOSIS — Z5329 Procedure and treatment not carried out because of patient's decision for other reasons: Secondary | ICD-10-CM | POA: Diagnosis present

## 2019-01-26 DIAGNOSIS — I161 Hypertensive emergency: Secondary | ICD-10-CM | POA: Diagnosis present

## 2019-01-26 DIAGNOSIS — Z7982 Long term (current) use of aspirin: Secondary | ICD-10-CM | POA: Diagnosis not present

## 2019-01-26 DIAGNOSIS — R040 Epistaxis: Secondary | ICD-10-CM | POA: Diagnosis not present

## 2019-01-26 DIAGNOSIS — R0602 Shortness of breath: Secondary | ICD-10-CM | POA: Diagnosis not present

## 2019-01-26 DIAGNOSIS — I428 Other cardiomyopathies: Secondary | ICD-10-CM | POA: Diagnosis present

## 2019-01-26 DIAGNOSIS — I2510 Atherosclerotic heart disease of native coronary artery without angina pectoris: Secondary | ICD-10-CM | POA: Diagnosis present

## 2019-01-26 DIAGNOSIS — Z87891 Personal history of nicotine dependence: Secondary | ICD-10-CM | POA: Diagnosis not present

## 2019-01-26 LAB — TROPONIN I (HIGH SENSITIVITY): Troponin I (High Sensitivity): 74 ng/L — ABNORMAL HIGH

## 2019-01-26 LAB — CBC
HCT: 39.5 % (ref 39.0–52.0)
Hemoglobin: 13.4 g/dL (ref 13.0–17.0)
MCH: 32.3 pg (ref 26.0–34.0)
MCHC: 33.9 g/dL (ref 30.0–36.0)
MCV: 95.2 fL (ref 80.0–100.0)
Platelets: 211 10*3/uL (ref 150–400)
RBC: 4.15 MIL/uL — ABNORMAL LOW (ref 4.22–5.81)
RDW: 12.8 % (ref 11.5–15.5)
WBC: 4.3 10*3/uL (ref 4.0–10.5)
nRBC: 0 % (ref 0.0–0.2)

## 2019-01-26 LAB — BASIC METABOLIC PANEL WITH GFR
Anion gap: 6 (ref 5–15)
BUN: 16 mg/dL (ref 6–20)
CO2: 27 mmol/L (ref 22–32)
Calcium: 8.9 mg/dL (ref 8.9–10.3)
Chloride: 104 mmol/L (ref 98–111)
Creatinine, Ser: 1.1 mg/dL (ref 0.61–1.24)
GFR calc Af Amer: >60 mL/min
GFR calc non Af Amer: >60 mL/min
Glucose, Bld: 119 mg/dL — ABNORMAL HIGH (ref 70–99)
Potassium: 4.2 mmol/L (ref 3.5–5.1)
Sodium: 137 mmol/L (ref 135–145)

## 2019-01-26 LAB — SARS CORONAVIRUS 2 (TAT 6-24 HRS): SARS Coronavirus 2: NEGATIVE

## 2019-01-26 LAB — ECHOCARDIOGRAM COMPLETE
Height: 68 in
Weight: 3440 [oz_av]

## 2019-01-26 MED ORDER — METOPROLOL SUCCINATE ER 25 MG PO TB24: 12.5000 mg | ORAL_TABLET | Freq: Every day | ORAL | 11 refills | Status: DC

## 2019-01-26 MED ORDER — VALSARTAN-HYDROCHLOROTHIAZIDE 80-12.5 MG PO TABS: 1.0000 | ORAL_TABLET | Freq: Every day | ORAL | 0 refills | Status: DC

## 2019-01-26 MED ORDER — HYDRALAZINE HCL 25 MG PO TABS: 25.0000 mg | ORAL_TABLET | Freq: Three times a day (TID) | ORAL | 0 refills | Status: DC

## 2019-01-26 MED ORDER — VALSARTAN-HYDROCHLOROTHIAZIDE 80-12.5 MG PO TABS: 2.0000 | ORAL_TABLET | Freq: Every day | ORAL | Status: DC

## 2019-01-26 MED ORDER — METOPROLOL SUCCINATE ER 25 MG PO TB24
25.0000 mg | ORAL_TABLET | Freq: Every day | ORAL | Status: DC
  Administered 2019-01-26: 25 mg via ORAL
  Filled 2019-01-26: qty 1

## 2019-01-26 MED ORDER — AMLODIPINE BESYLATE 10 MG PO TABS: 10.0000 mg | ORAL_TABLET | Freq: Every day | ORAL | 0 refills | Status: DC

## 2019-01-26 MED ORDER — ATORVASTATIN CALCIUM 20 MG PO TABS: 20.0000 mg | ORAL_TABLET | Freq: Every day | ORAL | 0 refills | Status: DC

## 2019-01-26 MED ORDER — IRBESARTAN 150 MG PO TABS
150.0000 mg | ORAL_TABLET | Freq: Every day | ORAL | Status: DC
  Administered 2019-01-26: 10:00:00 150 mg via ORAL
  Filled 2019-01-26: qty 1

## 2019-01-26 MED ORDER — HYDROCHLOROTHIAZIDE 25 MG PO TABS
25.0000 mg | ORAL_TABLET | Freq: Every day | ORAL | Status: DC
  Administered 2019-01-26: 25 mg via ORAL
  Filled 2019-01-26: qty 1

## 2019-01-26 MED ORDER — HYDRALAZINE HCL 25 MG PO TABS
25.0000 mg | ORAL_TABLET | Freq: Three times a day (TID) | ORAL | Status: DC
  Administered 2019-01-26 (×2): 25 mg via ORAL
  Filled 2019-01-26 (×5): qty 1

## 2019-01-26 NOTE — Progress Notes (Addendum)
PROGRESS NOTE  Terrance Carter [id number] DOB: Jun 25, 1960 DOA: 01/25/2019 PCP: Birdie Sons, MD   LOS: 0 days   Brief Narrative / Interim history: 58 year old male with history of hypertension, CAD, who came into the hospital with acute onset of epistaxis that started on 11/11 while at home, presented to the ED and found to have significantly elevated high blood pressure.  He reports nonadherence to blood pressure regimen and often skipping medications.  On ED presentation his blood pressure was 231/124.  He received Norvasc, Lopressor, Afrin and epistaxis resolved.  He denies any chest pain or shortness of breath.  Subjective / 24h Interval events: Feels a little bit better this morning however has occasional lightheadedness  Assessment & Plan: Principal Problem Hypertensive emergency due to nonadherence to medical therapy -Patient was started back on his amlodipine, his irbesartan hydrochlorothiazide having been increased to double home dose and his metoprolol was restarted. -Has persistently elevated blood pressure this morning of 205/108, I will add oral hydralazine in addition to his regimen.  He continues to require as needed intravenous antihypertensives and frequent adjustments of his antihypertensive regimen requiring ongoing inpatient monitoring -Discussed extensively with the patient at bedside, stressed importance of medication compliance as well as a low-sodium diet -2D echo has been ordered and pending  Active Problems Elevated troponin -Stable, likely demand ischemia due to elevated blood pressure -2D echo pending  Anterior epistaxis -Due to elevated blood pressure, this is resolved  Coronary artery disease -No chest pain, continue regimen as below  Hyperlipidemia -Continue statin   Scheduled Meds: . amLODipine  10 mg Oral Daily  . atorvastatin  20 mg Oral q1800  . enoxaparin (LOVENOX) injection  40 mg Subcutaneous Q24H  . fluticasone  2 spray Each Nare  Daily  . hydrALAZINE  25 mg Oral Q8H  . irbesartan  150 mg Oral Daily   And  . hydrochlorothiazide  25 mg Oral Daily  . metoprolol succinate  25 mg Oral Daily  . sodium chloride flush  3 mL Intravenous Q12H   Continuous Infusions: . sodium chloride     PRN Meds:.sodium chloride, acetaminophen **OR** acetaminophen, labetalol, magnesium hydroxide, nitroGLYCERIN, ondansetron **OR** ondansetron (ZOFRAN) IV, sodium chloride flush, traZODone  DVT prophylaxis: SCDs due to epistaxis Code Status: Full code Family Communication: Discussed with patient Disposition Plan: Home when ready  Consultants:  Cardiology  Procedures:  2D echo: Pending  Microbiology  SARS-CoV-2-negative  Antimicrobials: None   Objective: Vitals:   01/26/19 0813 01/26/19 0819 01/26/19 0939 01/26/19 0952  BP: (!) 190/108 (!) 205/108  (!) 204/102  Pulse:   98 94  Resp: 18     Temp:      TempSrc:      SpO2: 99%   97%  Weight:      Height:       No intake or output data in the 24 hours ending 01/26/19 1031 Filed Weights   01/25/19 1916  Weight: 97.5 kg    Examination:  Constitutional: NAD, pleasant, no distress Eyes: lids and conjunctivae normal, no scleral icterus ENMT: Mucous membranes are moist.  Neck: normal, supple Respiratory: clear to auscultation bilaterally, no wheezing, no crackles. Normal respiratory effort.  Cardiovascular: Regular rate and rhythm, no murmurs / rubs / gallops. No LE edema.  Abdomen: no tenderness. Bowel sounds positive.  Musculoskeletal: no clubbing / cyanosis.  Skin: no rashes Neurologic: Nonfocal, equal strength Psychiatric: Normal judgment and insight. Alert and oriented x 3. Normal mood.    Data Reviewed: I  have independently reviewed following labs and imaging studies   CBC: Recent Labs  Lab 01/25/19 1929 01/26/19 0556  WBC 4.3 4.3  HGB 13.9 13.4  HCT 42.0 39.5  MCV 97.0 95.2  PLT 244 [id number]   Basic Metabolic Panel: Recent Labs  Lab 01/25/19 1929  01/26/19 0556  NA 138 137  K 3.6 4.2  CL 101 104  CO2 27 27  GLUCOSE 120* 119*  BUN 15 16  CREATININE 1.21 1.10  CALCIUM 9.3 8.9   GFR: Estimated Creatinine Clearance: 80.8 mL/min (by C-G formula based on SCr of 1.1 mg/dL). Liver Function Tests: Recent Labs  Lab 01/25/19 1929  AST 24  ALT 20  ALKPHOS 65  BILITOT 0.7  PROT 8.2*  ALBUMIN 4.7   Coagulation Profile: No results for input(s): INR, PROTIME in the last 168 hours. HbA1C: No results for input(s): HGBA1C in the last 72 hours. CBG: No results for input(s): GLUCAP in the last 168 hours.  Recent Results (from the past 240 hour(s))  SARS CORONAVIRUS 2 (TAT 6-24 HRS) Nasopharyngeal Nasopharyngeal Swab     Status: None   Collection Time: 01/25/19  8:11 PM   Specimen: Nasopharyngeal Swab  Result Value Ref Range Status   SARS Coronavirus 2 NEGATIVE NEGATIVE Final    Comment: (NOTE) SARS-CoV-2 target nucleic acids are NOT DETECTED. The SARS-CoV-2 RNA is generally detectable in upper and lower respiratory specimens during the acute phase of infection. Negative results do not preclude SARS-CoV-2 infection, do not rule out co-infections with other pathogens, and should not be used as the sole basis for treatment or other patient management decisions. Negative results must be combined with clinical observations, patient history, and epidemiological information. The expected result is Negative. Fact Sheet for Patients: SugarRoll.be Fact Sheet for Healthcare Providers: https://www.woods-mathews.com/ This test is not yet approved or cleared by the [place] FDA and  has been authorized for detection and/or diagnosis of SARS-CoV-2 by FDA under an Emergency Use Authorization (EUA). This EUA will remain  in effect (meaning this test can be used) for the duration of the COVID-19 declaration under Section 56 4(b)(1) of the Act, 21 U.S.C. section 360bbb-3(b)(1), unless the  authorization is terminated or revoked sooner. Performed at [facility], [place] [address], [place], [place] [address]      Radiology Studies: Dg Chest Portable 1 View  Result Date: 01/25/2019 CLINICAL DATA:  Shortness of breath and elevated troponin EXAM: PORTABLE CHEST 1 VIEW COMPARISON:  02/11/2011 FINDINGS: Cardiac shadow is at the upper limits of normal in size. The lungs are clear bilaterally. No bony abnormality is seen. IMPRESSION: No acute abnormality noted. Electronically Signed   By: Inez Catalina M.D.   On: 01/25/2019 20:43   Marzetta Board, MD, PhD [facility]  Contact via  [email or web address]

## 2019-01-26 NOTE — Progress Notes (Signed)
*  PRELIMINARY RESULTS* Echocardiogram 2D Echocardiogram has been performed.  Sherrie Sport 01/26/2019, 1:38 PM

## 2019-01-26 NOTE — Progress Notes (Signed)
At approximately 1715, patient told nurse tech Loma Sousa he wanted to leave the hospital. NT Loma Sousa informed Probation officer, who asked Dr. Cruzita Lederer to see patient. Dr. Cruzita Lederer kindly spoke with patient who stated he would "think about staying," but wanted his prescriptions printed in case he left. Dr. Cruzita Lederer printed prescriptions for patient. Patient then came out of room and stated after thinking about it, he did want to leave. Writer obtained vital signs and informed patient his blood pressure was elevated at 224/128 (see vital sign flowsheet). Patient stated "oh, I better stay," and preceded to call and cancel his ride. Approximately ten minutes later, patient exited room and stated he was going to leave and go to "a different hospital." Patient signed e-form stating he understood he was leaving against medical advice and wished to leave anyway. IV removed, printed prescriptions given, and patient educated on signs of stroke. Patient left premises at 1800.

## 2019-01-26 NOTE — Consult Note (Signed)
[facility] Cardiology  CARDIOLOGY CONSULT NOTE  Patient ID: Terrance Carter MRN: [id number] DOB/AGE: [birth year] 58 y.o.  Admit date: 01/25/2019 Referring Physician Dr Sidney Ace  hospitalist Primary Physician Dr. Juanetta Beets primary Primary Cardiologist cardiologist [facility] Reason for Consultation hypertensive urgency borderline troponin  HPI: Patient 58 year old male known head neck cancer known nonischemic cardiomyopathy hyperlipidemia some noncompliance reportedly quit taking his blood pressure medications about a month ago when he ran out he did not refill them.  Patient started having nose bleeding finally came to emergency room was found to have extremely elevated blood pressure systolic over [id number] patient was restarted on blood pressure medications gradually blood pressure improved he was found to have borderline high intensity troponins which were essentially unremarkable patient was scheduled for echocardiogram resume his statin therapy denies any chest pain no significant shortness of breath works as a Therapist, nutritional has been doing reasonably well recently  Review of systems complete and found to be negative unless listed above  Constitutional no significant change mild chronic hoarseness Respiratory no significant shortness of breath pneumonia or heart failure Cardiovascular no palpitations no tachycardia no myocardial infarction  GI no significant bleeding no GERD Neurologic exam grossly intact     Past Medical History:  Diagnosis Date  . Cancer (HCC) Laryngeal  . History of chicken pox   . History of measles   . History of mumps   . Hypertension   . MI, old     Past Surgical History:  Procedure Laterality Date  . 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 Dr. Tami Ribas    (Not in a hospital admission)  Social History   Socioeconomic History  . Marital status: Single    Spouse name: Not on file  . Number of children: Not on file  . Years of education: Not on file  . Highest  education level: Not on file  Occupational History  . Occupation: Disabled    Comment: Disabled due to [employer] of [employer] fast streamer  Social Needs  . Financial resource strain: Not on file  . Food insecurity    Worry: Not on file    Inability: Not on file  . Transportation needs    Medical: Not on file    Non-medical: Not on file  Tobacco Use  . Smoking status: Former Smoker    Packs/day: 1.00    Years: 20.00    Pack years: 20.00  . Smokeless tobacco: Never Used  Substance and Sexual Activity  . Alcohol use: Yes    Alcohol/week: 0.0 standard drinks    Comment: Drinks wine once a month  . Drug use: No  . Sexual activity: Not on file  Lifestyle  . Physical activity    Days per week: Not on file    Minutes per session: Not on file  . Stress: Not on file  Relationships  . Social Herbalist on phone: Not on file    Gets together: Not on file    Attends religious service: Not on file    Active member of club or organization: Not on file    Attends meetings of clubs or organizations: Not on file    Relationship status: Not on file  . Intimate partner violence    Fear of current or ex partner: Not on file    Emotionally abused: Not on file    Physically abused: Not on file    Forced sexual activity: Not on file  Other Topics Concern  . Not  on file  Social History Narrative  . Not on file    Family History  Problem Relation Age of Onset  . Diabetes Mother        type 2  . Hypertension Mother   . Diabetes Maternal Uncle       Review of systems complete and found to be negative unless listed above      PHYSICAL EXAM  General: Well developed, well nourished, in no acute distress HEENT:  Normocephalic and atramatic Neck:  No JVD.  Lungs: Clear bilaterally to auscultation and percussion. Heart: HRRR . Normal S1 and S2 without gallops or murmurs.  Abdomen: Bowel sounds are positive, abdomen soft and non-tender  Msk:  Back normal, normal gait. Normal strength and  tone for age. Extremities: No clubbing, cyanosis or edema.   Neuro: Alert and oriented X 3. Psych:  Good affect, responds appropriately  Labs:   Lab Results  Component Value Date   WBC 4.3 01/26/2019   HGB 13.4 01/26/2019   HCT 39.5 01/26/2019   MCV 95.2 01/26/2019   PLT 211 01/26/2019    Recent Labs  Lab 01/25/19 1929 01/26/19 0556  NA 138 137  K 3.6 4.2  CL 101 104  CO2 27 27  BUN 15 16  CREATININE 1.21 1.10  CALCIUM 9.3 8.9  PROT 8.2*  --   BILITOT 0.7  --   ALKPHOS 65  --   ALT 20  --   AST 24  --   GLUCOSE 120* 119*   Lab Results  Component Value Date   TROPONINI 0.03 (HH) 05/28/2016    Lab Results  Component Value Date   CHOL 175 03/31/2017   Lab Results  Component Value Date   HDL 51 03/31/2017   Lab Results  Component Value Date   LDLCALC 101 (H) 03/31/2017   Lab Results  Component Value Date   TRIG 115 03/31/2017   Lab Results  Component Value Date   CHOLHDL 3.4 03/31/2017   No results found for: LDLDIRECT    Radiology: Dg Chest Portable 1 View  Result Date: 01/25/2019 CLINICAL DATA:  Shortness of breath and elevated troponin EXAM: PORTABLE CHEST 1 VIEW COMPARISON:  02/11/2011 FINDINGS: Cardiac shadow is at the upper limits of normal in size. The lungs are clear bilaterally. No bony abnormality is seen. IMPRESSION: No acute abnormality noted. Electronically Signed   By: Inez Catalina M.D.   On: 01/25/2019 20:43    EKG: Nonspecific normal sinus rhythm  ASSESSMENT AND PLAN:  Hypertensive urgency Anterior epistaxis Borderline troponin Dyslipidemia Coronary artery disease Known mild cardiomyopathy possibly related to chemo History of head and neck cancer DVT prophylaxis  Plan Agree with admit to telemetry hold Continue regimen blood pressure management beta-blocker ARB amlodipine No direct evaluation for demand ischemia Agree with echocardiogram for further evaluation Continue lipid management Continue ARB beta-blocker aspirin  nitroglycerin as needed DVT prophylaxis for now Advised patient be compliant with blood pressure medications  Signed: Yolonda Kida MD,  01/26/2019, 9:47 PM

## 2019-01-27 NOTE — Discharge Summary (Signed)
Physician [place] discharge Summary  Terrance Carter [id number] DOB: 11/21/1960 DOA: 01/25/2019  PCP: Terrance Sons, MD  Admit date: 01/25/2019 Discharge date: 01/27/2019  Admitted From: home Disposition: Guarded, patient left AGAINST MEDICAL ADVICE  Recommendations for Outpatient Follow-up:  1. Follow up with PCP ASAP   Discharge Condition: Guarded CODE STATUS: Full code Diet recommendation: Low-sodium, heart healthy  HPI: Per admitting MD, Terrance Carter  is a 58 y.o. African-American male with a known history of hypertension and coronary artery disease, who presented to emergency room with acute onset of epistaxis that started today while he was at home with associated mild headache and dizziness.  He denied any blurred vision, diplopia, paresthesias or focal muscle weakness.  He denied any presyncope or syncope.  No cough or wheezing or dyspnea or palpitations or chest pain.  No fever or chills.  He denied any recent sick exposures.  No other bleeding diathesis. Upon presentation to the emergency room, initial blood pressure was 231/124 with a pulse of 109 with otherwise normal vital signs.  Labs revealed high-sensitivity troponin I of 83.  His COVID-19 test came back negative and portable chest ray showed no acute cardiopulmonary disease showed normal sinus rhythm with rate of 84 with early repolarization anteroseptally and prolonged QT interval with QTC of 482 MS. The patient was given Norvasc 10 mg p.o. as well as Lopressor 12.5 mg p.o. and nasal Afrin spray.  His epistaxis has already resolved before he received Afrin.  He will be placed in observation on telemetry for further evaluation and management of his hypertensive urgency  Hospital Course / Discharge diagnoses: Principal Problem Hypertensive emergency due to nonadherence to medical therapy -Patient was started back on his home regimen with amlodipine, metoprolol, Diovan with persistently elevated blood  pressure into the 180s-200s, and hydralazine was added with slight improvement into the 150s.  His blood pressure varied significantly while in the ED from [id number] systolic to persistently elevated above [id number] systolic.  I was called back at bedside around 5:30 PM that patient wants to leave [place].  I have discussed extensively trying to convince patient to remain hospitalized at least overnight to assess blood pressure control once oral regimen was initiated.  He initially agreed to stay after seeing that his last BP was 224/128, however after my discussion per RN he signed AMA paperwork and left the hospital.  Minimum intervention was done and patient received prescriptions for the blood pressure medications that he ran out as well as hydralazine based on preliminary evaluation in the ED.  Eventually, patient unfortunately desired to leave the Hospital immidiately, patient has been warned that this is not Medically advisable at this time, and can result in Medical complications like Death and Disability, patient understands and accepts the risks involved and assumes full responsibilty of this decision.  Cardiology evaluated patient as well.  He underwent a 2D echo which showed an EF of 60 to 65% with features of LVH.  Active Problems Elevated troponin -likely demand ischemia due to elevated blood pressure and hypertensive emergency with endorgan damage Anterior epistaxis -Due to elevated blood pressure, this is resolved Coronary artery disease -No chest pain, continue blood pressure control Hyperlipidemia -Continue statin   Discharge Instructions   Allergies as of 01/26/2019   No Known Allergies     Medication List    TAKE these medications   amLODipine 10 MG tablet Commonly known as: NORVASC Take 1 tablet (10 mg total) by mouth daily.  atorvastatin 20 MG tablet Commonly known as: LIPITOR Take 1 tablet (20 mg total) by mouth daily.   hydrALAZINE 25 MG tablet Commonly  known as: APRESOLINE Take 1 tablet (25 mg total) by mouth every 8 (eight) hours.   metoprolol succinate 25 MG 24 hr tablet Commonly known as: TOPROL-XL Take 0.5 tablets (12.5 mg total) by mouth daily.   valsartan-hydrochlorothiazide 80-12.5 MG tablet Commonly known as: DIOVAN-HCT Take 1 tablet by mouth daily.     ASK your doctor about these medications   aspirin EC 81 MG tablet Take 81 mg by mouth daily.   fluticasone 50 MCG/ACT nasal spray Commonly known as: FLONASE Place 2 sprays into both nostrils daily.        Consultations:  Cardiology  Procedures/Studies:  2D echo  IMPRESSIONS  1. Left ventricular ejection fraction, by visual estimation, is 60 to 65%. The left ventricle has normal function. Left ventricular septal wall thickness was moderately increased. Moderately increased left ventricular posterior wall thickness. There is  moderately increased left ventricular hypertrophy.  2. Left ventricular diastolic parameters are consistent with Grade I diastolic dysfunction (impaired relaxation).  3. Global right ventricle has normal systolic function.The right ventricular size is normal. No increase in right ventricular wall thickness.  4. Left atrial size was mildly dilated.  5. Right atrial size was normal.  6. The mitral valve is grossly normal. Trace mitral valve regurgitation.  7. The tricuspid valve is grossly normal. Tricuspid valve regurgitation is trivial.  8. The aortic valve is tricuspid. Aortic valve regurgitation is not visualized.  9. The pulmonic valve was not well visualized. Pulmonic valve regurgitation is trivial. 10. Normal pulmonary artery systolic pressure. 11. The atrial septum is grossly normal.   Dg Chest Portable 1 View  Result Date: 01/25/2019 CLINICAL DATA:  Shortness of breath and elevated troponin EXAM: PORTABLE CHEST 1 VIEW COMPARISON:  02/11/2011 FINDINGS: Cardiac shadow is at the upper limits of normal in size. The lungs are clear  bilaterally. No bony abnormality is seen. IMPRESSION: No acute abnormality noted. Electronically Signed   By: Inez Catalina M.D.   On: 01/25/2019 20:43     Subjective: - no chest pain, shortness of breath, no abdominal pain, nausea or vomiting.   Discharge Exam: Left AMA   The results of significant diagnostics from this hospitalization (including imaging, microbiology, ancillary and laboratory) are listed below for reference.     Microbiology: Recent Results (from the past 240 hour(s))  SARS CORONAVIRUS 2 (TAT 6-24 HRS) Nasopharyngeal Nasopharyngeal Swab     Status: None   Collection Time: 01/25/19  8:11 PM   Specimen: Nasopharyngeal Swab  Result Value Ref Range Status   SARS Coronavirus 2 NEGATIVE NEGATIVE Final    Comment: (NOTE) SARS-CoV-2 target nucleic acids are NOT DETECTED. The SARS-CoV-2 RNA is generally detectable in upper and lower respiratory specimens during the acute phase of infection. Negative results do not preclude SARS-CoV-2 infection, do not rule out co-infections with other pathogens, and should not be used as the sole basis for treatment or other patient management decisions. Negative results must be combined with clinical observations, patient history, and epidemiological information. The expected result is Negative. Fact Sheet for Patients: SugarRoll.be Fact Sheet for Healthcare Providers: https://www.woods-mathews.com/ This test is not yet approved or cleared by the [place] FDA and  has been authorized for detection and/or diagnosis of SARS-CoV-2 by FDA under an Emergency Use Authorization (EUA). This EUA will remain  in effect (meaning this test can be used) for  the duration of the COVID-19 declaration under Section 56 4(b)(1) of the Act, 21 U.S.C. section 360bbb-3(b)(1), unless the authorization is terminated or revoked sooner. Performed at [facility], [place] [address], [place],  [place] [address]      Labs: Basic Metabolic Panel: Recent Labs  Lab 01/25/19 1929 01/26/19 0556  NA 138 137  K 3.6 4.2  CL 101 104  CO2 27 27  GLUCOSE 120* 119*  BUN 15 16  CREATININE 1.21 1.10  CALCIUM 9.3 8.9   Liver Function Tests: Recent Labs  Lab 01/25/19 1929  AST 24  ALT 20  ALKPHOS 65  BILITOT 0.7  PROT 8.2*  ALBUMIN 4.7   CBC: Recent Labs  Lab 01/25/19 1929 01/26/19 0556  WBC 4.3 4.3  HGB 13.9 13.4  HCT 42.0 39.5  MCV 97.0 95.2  PLT 244 211   CBG: No results for input(s): GLUCAP in the last 168 hours. Hgb A1c No results for input(s): HGBA1C in the last 72 hours. Lipid Profile No results for input(s): CHOL, HDL, LDLCALC, TRIG, CHOLHDL, LDLDIRECT in the last 72 hours. Thyroid function studies No results for input(s): TSH, T4TOTAL, T3FREE, THYROIDAB in the last 72 hours.  Invalid input(s): FREET3 Urinalysis    Component Value Date/Time   COLORURINE Yellow 02/11/2013 [place] 02/11/2013 0717   LABSPEC 1.023 02/11/2013 0717   PHURINE 5.0 02/11/2013 0717   GLUCOSEU Negative 02/11/2013 0717   HGBUR Negative 02/11/2013 0717   BILIRUBINUR Negative 02/11/2013 0717   KETONESUR Negative 02/11/2013 0717   PROTEINUR 100 mg/dL 02/11/2013 0717   NITRITE Negative 02/11/2013 0717   LEUKOCYTESUR Negative 02/11/2013 0717    FURTHER DISCHARGE INSTRUCTIONS:   Get Medicines reviewed and adjusted: Please take all your medications with you for your next visit with your Primary MD   Laboratory/radiological data: Please request your Primary MD to go over all hospital tests and procedure/radiological results at the follow up, please ask your Primary MD to get all Hospital records sent to his/her office.   In some cases, they will be blood work, cultures and biopsy results pending at the time of your discharge. Please request that your primary care M.D. goes through all the records of your hospital data and follows up on these results.   Also Note the  following: If you experience worsening of your admission symptoms, develop shortness of breath, life threatening emergency, suicidal or homicidal thoughts you must seek medical attention immediately by calling 911 or calling your MD immediately  if symptoms less severe.   You must read complete instructions/literature along with all the possible adverse reactions/side effects for all the Medicines you take and that have been prescribed to you. Take any new Medicines after you have completely understood and accpet all the possible adverse reactions/side effects.    Do not drive when taking Pain medications or sleeping medications (Benzodaizepines)   Do not take more than prescribed Pain, Sleep and Anxiety Medications. It is not advisable to combine anxiety,sleep and pain medications without talking with your primary care practitioner   Special Instructions: If you have smoked or chewed Tobacco  in the last 2 yrs please stop smoking, stop any regular Alcohol  and or any Recreational drug use.   Wear Seat belts while driving.   Please note: You were cared for by a hospitalist during your hospital stay. Once you are discharged, your primary care physician will handle any further medical issues. Please note that NO REFILLS for any discharge  medications will be authorized once you are discharged, as it is imperative that you return to your primary care physician (or establish a relationship with a primary care physician if you do not have one) for your post hospital discharge needs so that they can reassess your need for medications and monitor your lab values.  Time coordinating discharge: 25 minutes  SIGNED:  Marzetta Board, MD, PhD 01/27/2019, 6:38 AM

## 2019-01-28 ENCOUNTER — Emergency Department
Admission: EM | Admit: 2019-01-28 | Discharge: 2019-01-28 | Disposition: A | Discharge status: Home / Self Care | Payer: Medicaid Other | Attending: Emergency Medicine | Admitting: Emergency Medicine

## 2019-01-28 ENCOUNTER — Other Ambulatory Visit: Payer: Self-pay

## 2019-01-28 DIAGNOSIS — R040 Epistaxis: Secondary | ICD-10-CM | POA: Diagnosis not present

## 2019-01-28 DIAGNOSIS — R58 Hemorrhage, not elsewhere classified: Secondary | ICD-10-CM | POA: Diagnosis not present

## 2019-01-28 DIAGNOSIS — R000 Tachycardia, unspecified: Secondary | ICD-10-CM | POA: Diagnosis not present

## 2019-01-28 DIAGNOSIS — Z7982 Long term (current) use of aspirin: Secondary | ICD-10-CM | POA: Diagnosis not present

## 2019-01-28 DIAGNOSIS — I10 Essential (primary) hypertension: Secondary | ICD-10-CM | POA: Diagnosis not present

## 2019-01-28 DIAGNOSIS — Z87891 Personal history of nicotine dependence: Secondary | ICD-10-CM | POA: Diagnosis not present

## 2019-01-28 MED ORDER — PHENYLEPHRINE HCL 0.25 % NA SOLN
1.0000 | Freq: Once | NASAL | Status: AC
  Administered 2019-01-28: 1 via NASAL
  Filled 2019-01-28: qty 15

## 2019-01-28 MED ORDER — OXYMETAZOLINE HCL 0.05 % NA SOLN
1.0000 | Freq: Once | NASAL | Status: AC
  Administered 2019-01-28: 17:00:00 1 via NASAL
  Filled 2019-01-28: qty 30

## 2019-01-28 MED ORDER — AMOXICILLIN-POT CLAVULANATE 875-125 MG PO TABS: 1.0000 | ORAL_TABLET | Freq: Two times a day (BID) | ORAL | 0 refills | Status: DC

## 2019-01-28 NOTE — ED Notes (Signed)
Roderic Palau PA notified pt's BP remains elevated. No new orders.

## 2019-01-28 NOTE — ED Notes (Signed)
Roderic Palau PA to bedside.

## 2019-01-28 NOTE — ED Notes (Signed)
This RN entered room and pt had nose clip in lap. Had already explained to pt that the clip needed to stay on. Pt's nose continues to bleed. Roderic Palau PA notified in person.

## 2019-01-28 NOTE — ED Notes (Signed)
Pt calm, alert, denies pain/nausea. Nose not currently bleeding but pt keep coughing really hard to try and get blood out of throat. Educated.

## 2019-01-28 NOTE — ED Triage Notes (Signed)
Pt in via EMS from home. 100.1 ax temp with EMS; ST 124 on monitor with EMS; nose bleed/spitting up blood; clip applied to nose by EMS; 186/106 BP with EMS; pt denies being on blood thinners or having history of liver issues. BG 179 with EMS. Trach history and throat cancer history.

## 2019-01-28 NOTE — ED Notes (Signed)
TV adjusted for pt. Channel turned to [facility] as requested. Pt given new mask as his fell on the floor.

## 2019-01-28 NOTE — ED Provider Notes (Signed)
[facility] Emergency Department Provider Note  ____________________________________________  Time seen: Approximately 5:06 PM  I have reviewed the triage vital signs and the nursing notes.   HISTORY  Chief Complaint No chief complaint on file.    HPI Terrance Carter is a 58 y.o. male who presents the emergency department for evaluation of epistaxis.  Patient had been seen several days prior for complaint of epistaxis and hypertension.  Patient had been admitted, states that he had filled the prescriptions, and scheduled for follow-up with primary care in 2 days.  Patient's been taking medications as prescribed.  Patient states that today he felt congested, was picking at his nose, forcibly blowing when he developed epistaxis out of the left nares.  Given the fact the patient recently had similar symptoms and was admitted he became concerned and presents emergency department for evaluation.  Patient denies any other complaints at this time other than left-sided epistaxis.  He denies any headache, fevers or chills, sore throat, cough, shortness of breath abdominal pain, nausea or vomiting.         Past Medical History:  Diagnosis Date  . Cancer (HCC) Laryngeal  . History of chicken pox   . History of measles   . History of mumps   . Hypertension   . MI, old     Patient Active Problem List   Diagnosis Date Noted  . Hypertensive emergency 01/26/2019  . Hypertensive urgency 01/25/2019  . Hypertensive cardiomyopathy ([place]) 03/26/2017  . 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 Change in vision 11/23/2014  . Dysphagia 11/23/2014  . Heart disease 11/23/2014  . Hidradenitis suppurativa 11/23/2014  . 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 Keloid 11/23/2014  . 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 Dr. Tami Ribas     Prior to Admission medications   Medication Sig Start Date End Date Taking? Authorizing Provider  amLODipine (NORVASC) 10 MG tablet Take 1 tablet (10 mg total) by mouth daily. 01/26/19   Caren Griffins, MD  amoxicillin-clavulanate (AUGMENTIN) 875-125 MG tablet Take 1 tablet by mouth 2 (two) times daily. 01/28/19   Dakota Stangl, Charline Bills, PA-C  aspirin EC 81 MG tablet Take 81 mg by mouth daily.    [provider]  atorvastatin (LIPITOR) 20 MG tablet Take 1 tablet (20 mg total) by mouth daily. 01/26/19   Caren Griffins, MD  fluticasone (FLONASE) 50 MCG/ACT nasal spray Place 2 sprays into both nostrils daily. Patient not taking: Reported on 01/26/2019 09/24/14   Birdie Sons, MD  hydrALAZINE (APRESOLINE) 25 MG tablet Take 1 tablet (25 mg total) by mouth every 8 (eight) hours. 01/26/19   Caren Griffins, MD  metoprolol succinate (TOPROL-XL) 25 MG 24 hr tablet Take 0.5 tablets (12.5 mg total) by mouth daily. 01/26/19 01/26/20  Caren Griffins, MD  valsartan-hydrochlorothiazide (DIOVAN-HCT) 80-12.5 MG tablet Take 1 tablet by mouth daily. 01/26/19   Caren Griffins, MD    Allergies Patient has no known allergies.  Family History  Problem Relation Age of Onset  . Diabetes Mother        type 2  . Hypertension Mother   . Diabetes Maternal Uncle     Social History Social History   Tobacco Use  . Smoking status: Former Smoker    Packs/day: 1.00    Years: 20.00  Pack years: 20.00  . Smokeless tobacco: Never Used  Substance Use Topics  . Alcohol use: Yes    Alcohol/week: 0.0 standard drinks    Comment: Drinks wine once a month  . Drug use: No     Review of Systems  Constitutional: No fever/chills Eyes: No visual changes. No discharge ENT: Left-sided epistaxis Cardiovascular: no chest pain. Respiratory: no cough. No SOB. Gastrointestinal: No abdominal pain.  No nausea, no vomiting.  No diarrhea.  No constipation. Musculoskeletal: Negative for  musculoskeletal pain. Skin: Negative for rash, abrasions, lacerations, ecchymosis. Neurological: Negative for headaches, focal weakness or numbness. 10-point ROS otherwise negative.  ____________________________________________   PHYSICAL EXAM:  VITAL SIGNS: ED Triage Vitals [01/28/19 1658]  Enc Vitals Group     BP      Pulse      Resp      Temp 99.3 F (37.4 C)     Temp Source Axillary     SpO2      Weight      Height      Head Circumference      Peak Flow      Pain Score      Pain Loc      Pain Edu?      Excl. in [place]?      Constitutional: Alert and oriented. Well appearing and in no acute distress. Eyes: Conjunctivae are normal. PERRL. EOMI. Head: Atraumatic. ENT:      Ears:       Nose: No congestion/rhinnorhea.  Visualization of bilateral nares reveals no acute findings to the right nares.  Patient does have some congealed blood at the distal portion of the nares and very minimal active bleeding.  Source is not readily identifiable given congealed blood.  Bleeding does not cease with direct pressure, however if patient coughs or sneezes this immediately returns.      Mouth/Throat: Mucous membranes are moist.  Neck: No stridor.    Cardiovascular: Normal rate, regular rhythm. Normal S1 and S2.  Good peripheral circulation. Respiratory: Normal respiratory effort without tachypnea or retractions. Lungs CTAB. Good air entry to the bases with no decreased or absent breath sounds. Musculoskeletal: Full range of motion to all extremities. No gross deformities appreciated. Neurologic:  Normal speech and language. No gross focal neurologic deficits are appreciated.  Skin:  Skin is warm, dry and intact. No rash noted. Psychiatric: Mood and affect are normal. Speech and behavior are normal. Patient exhibits appropriate insight and judgement.   ____________________________________________   LABS (all labs ordered are listed, but only abnormal results are displayed)  Labs  Reviewed - No data to display ____________________________________________  EKG   ____________________________________________  RADIOLOGY   No results found.  ____________________________________________    PROCEDURES  Procedure(s) performed:    .Epistaxis Management  Date/Time: 01/28/2019 8:54 PM Performed by: Darletta Moll, PA-C Authorized by: Darletta Moll, PA-C   Consent:    Consent obtained:  Verbal   Consent given by:  Patient   Risks discussed:  Bleeding, infection, nasal injury and pain Anesthesia (see MAR for exact dosages):    Anesthesia method:  None Procedure details:    Treatment site:  Unable to specify   Treatment method:  Nasal balloon   Treatment complexity:  Limited   Treatment episode: initial   Post-procedure details:    Assessment:  Bleeding stopped   Patient tolerance of procedure:  Tolerated well, no immediate complications Comments:     Using Rhino Rocket, left nares is packed.  This results in good cessation of bleeding.  Packing left in place.  Patient will be referred to ENT for removal.      Medications  phenylephrine (NEO-SYNEPHRINE) 0.25 % nasal spray 1 spray (1 spray Each Nare Given 01/28/19 1741)  oxymetazoline (AFRIN) 0.05 % nasal spray 1 spray (1 spray Each Nare Given 01/28/19 1727)     ____________________________________________   INITIAL IMPRESSION / ASSESSMENT AND PLAN / ED COURSE  Pertinent labs & imaging results that were available during my care of the patient were reviewed by me and considered in my medical decision making (see chart for details).  Review of the [facility] CSRS was performed in accordance of the [place] prior to dispensing any controlled drugs.  Clinical Course as of Jan 27 2058  Sat Jan 28, 2019  1721 Patient presents with epistaxis of the left nares.  Patient had epistaxis 3 days ago, was admitted for hypertensive urgency.  On exam today, patient has mild amount of left-sided epistaxis.   Patient states that he was blowing his nose forcibly given the amount of congestion.  I suspect that patient loosened clot and has return of epistaxis.  At this time, I reviewed patient's previous visit records, patient's labs are reassuring and I see no reason to repeat labs at this time.  No indication for imaging.  Patient will be treated with Afrin and phenylephrine with nasal clip to see if this resolves symptoms.  If not, will consider using a nasal Rhino Rocket and TXA if needed   [JC]    Clinical Course User Index [JC] Pedrohenrique Mcc[place], Charline Bills, PA-C          Patient's diagnosis is consistent with epistaxis.  Patient presented to the emergency department with epistaxis.  Patient been seen several days prior during a hypertensive emergency and was admitted for same.  At that time patient had a good cessation of epistaxis without aggressive treatment.  Tonight both phenylephrine and Afrin were unsuccessful in reducing the bleeding and patient had Rhino Rocket placed in the left nares.  Patient had some ongoing right nasal bleeding for approximately 20 minutes after packing but this had good cessation prior to discharge.  Patient has had no postnasal hemorrhage or any ongoing epistaxis to either nares after packing was placed.  Patient will be placed on antibiotics prophylactically and referred to ENT for removal of Rhino Rocket.  Patient already has an ENT from history of previous throat cancer.  Patient will follow up with him for removal.  Return precautions are discussed at length with the patient.  Patient was placed on Augmentin..  Patient is given ED precautions to return to the ED for any worsening or new symptoms.     ____________________________________________  FINAL CLINICAL IMPRESSION(S) / ED DIAGNOSES  Final diagnoses:  Epistaxis      NEW MEDICATIONS STARTED DURING THIS VISIT:  ED Discharge Orders         Ordered    amoxicillin-clavulanate (AUGMENTIN) 875-125 MG tablet  2  times daily     01/28/19 2058              This chart was dictated using voice recognition software/Dragon. Despite best efforts to proofread, errors can occur which can change the meaning. Any change was purely unintentional.    Darletta Moll, PA-C 01/28/19 2059    Arta Silence, MD 01/28/19 2303

## 2019-01-28 NOTE — ED Notes (Addendum)
Roderic Palau PA to bedside. Pt's nose slowly bleeding again.

## 2019-01-28 NOTE — ED Notes (Signed)
Pt up to bedside toilet.  

## 2019-01-29 DIAGNOSIS — R42 Dizziness and giddiness: Secondary | ICD-10-CM | POA: Diagnosis not present

## 2019-01-29 DIAGNOSIS — R040 Epistaxis: Secondary | ICD-10-CM | POA: Diagnosis not present

## 2019-01-29 DIAGNOSIS — Z8521 Personal history of malignant neoplasm of larynx: Secondary | ICD-10-CM | POA: Diagnosis not present

## 2019-01-29 DIAGNOSIS — Z87891 Personal history of nicotine dependence: Secondary | ICD-10-CM | POA: Diagnosis not present

## 2019-01-29 DIAGNOSIS — I119 Hypertensive heart disease without heart failure: Secondary | ICD-10-CM | POA: Diagnosis not present

## 2019-01-29 DIAGNOSIS — R0981 Nasal congestion: Secondary | ICD-10-CM | POA: Diagnosis not present

## 2019-01-30 ENCOUNTER — Ambulatory Visit (INDEPENDENT_AMBULATORY_CARE_PROVIDER_SITE_OTHER): Payer: Medicaid Other | Admitting: Family Medicine

## 2019-01-30 ENCOUNTER — Encounter: Payer: Self-pay | Admitting: Family Medicine

## 2019-01-30 ENCOUNTER — Other Ambulatory Visit: Payer: Self-pay

## 2019-01-30 VITALS — BP 130/64 | HR 110 | Temp 96.6°F | Resp 16 | Wt 196.0 lb

## 2019-01-30 DIAGNOSIS — R040 Epistaxis: Secondary | ICD-10-CM | POA: Diagnosis not present

## 2019-01-30 DIAGNOSIS — D500 Iron deficiency anemia secondary to blood loss (chronic): Secondary | ICD-10-CM | POA: Diagnosis not present

## 2019-01-30 DIAGNOSIS — I10 Essential (primary) hypertension: Secondary | ICD-10-CM

## 2019-01-30 MED ORDER — INFLUENZA VAC SPLIT QUAD 0.5 ML IM SUSY
0.50 | PREFILLED_SYRINGE | INTRAMUSCULAR | Status: DC
Start: ? — End: 2019-01-30

## 2019-01-30 MED ORDER — FERROUS SULFATE 325 (65 FE) MG PO TBEC: 325.0000 mg | DELAYED_RELEASE_TABLET | Freq: Every day | ORAL | Status: DC

## 2019-01-30 NOTE — Patient Instructions (Signed)
.   Please review the attached list of medications and notify my office if there are any errors.   . Please bring all of your medications to every appointment so we can make sure that our medication list is the same as yours.   . Start taking Over the counter Iron Sulfate 325mg  once a day

## 2019-01-30 NOTE — Progress Notes (Signed)
Patient: Terrance Carter Male    DOB: 22-Jul-1960   58 y.o.   MRN: [id number] Visit Date: 01/30/2019  Today's Provider: Lelon Huh, MD   Chief Complaint  Patient presents with  . Follow-up   Subjective:     HPI  Follow up ER visit  Patient was seen in ER for Hypertension and epistaxis on 01/25/2019. He was seen again on 01/28/2019 and 01/29/2019 at [facility] for Epistaxis. Noted to slightly anemic with dropping from 13.9 on 01/25/2019 at [facility] to 12.3 at [facility] on 01/29/2019.  Left nasopharynx was packed in at [facility] ER on 11/14 and he was advised to follow up with ENT within 5 days.  Marland Kitchen He reports good compliance with treatment. He reports this condition is Unchanged. He initially elevated 194/96 since he had run of BP medications for the last few months, but has since started back on everything. Was prescribed hydralazine from ER, but has not gotten this prescription filled.   ------------------------------------------------------------------------------------   No Known Allergies   Current Outpatient Medications:  .  amLODipine (NORVASC) 10 MG tablet, Take 1 tablet (10 mg total) by mouth daily., Disp: 30 tablet, Rfl: 0 .  amoxicillin-clavulanate (AUGMENTIN) 875-125 MG tablet, Take 1 tablet by mouth 2 (two) times daily., Disp: 14 tablet, Rfl: 0 .  aspirin EC 81 MG tablet, Take 81 mg by mouth daily., Disp: , Rfl:  .  metoprolol succinate (TOPROL-XL) 25 MG 24 hr tablet, Take 0.5 tablets (12.5 mg total) by mouth daily., Disp: 15 tablet, Rfl: 11 .  nitroGLYCERIN (NITROSTAT) 0.4 MG SL tablet, Place 0.4 mg under the tongue every 5 (five) minutes as needed for chest pain., Disp: , Rfl:  .  oxymetazoline (AFRIN) 0.05 % nasal spray, Place 2 sprays into both nostrils 2 (two) times daily., Disp: , Rfl:  .  valsartan-hydrochlorothiazide (DIOVAN-HCT) 80-12.5 MG tablet, Take 1 tablet by mouth daily., Disp: 30 tablet, Rfl: 0 .  atorvastatin (LIPITOR) 20 MG tablet, Take 1 tablet (20 mg total) by  mouth daily. (Patient not taking: Reported on 01/30/2019), Disp: 30 tablet, Rfl: 0 .  fluticasone (FLONASE) 50 MCG/ACT nasal spray, Place 2 sprays into both nostrils daily. (Patient not taking: Reported on 01/26/2019), Disp: 16 g, Rfl: 3 .  hydrALAZINE (APRESOLINE) 25 MG tablet, Take 1 tablet (25 mg total) by mouth every 8 (eight) hours. (Patient not taking: Reported on 01/30/2019), Disp: 90 tablet, Rfl: 0  Review of Systems  Constitutional: Negative for appetite change, chills and fever.  HENT: Positive for nosebleeds.   Respiratory: Negative for chest tightness, shortness of breath and wheezing.   Cardiovascular: Negative for chest pain and palpitations.  Gastrointestinal: Negative for abdominal pain, nausea and vomiting.    Social History   Tobacco Use  . Smoking status: Former Smoker    Packs/day: 1.00    Years: 20.00    Pack years: 20.00  . Smokeless tobacco: Never Used  Substance Use Topics  . Alcohol use: Yes    Alcohol/week: 0.0 standard drinks    Comment: Drinks wine once a month      Objective:   BP 130/64 (BP Location: Left Arm, Patient Position: Sitting, Cuff Size: Large)   Pulse (!) 110   Temp (!) 96.6 F (35.9 C)   Resp 16   Wt 196 lb (88.9 kg)   SpO2 96% Comment: room air  BMI 30.70 kg/m  Vitals:   01/30/19 0903  BP: 130/64  Pulse: (!) 110  Resp: 16  Temp: (!) 96.6 F (35.9 C)  SpO2: 96%  Weight: 196 lb (88.9 kg)  Body mass index is 30.7 kg/m.   Physical Exam  General appearance: Well developed, well nourished male, cooperative and in no acute distress Head: Left nares packed. No active bleeding.  Cardiac: Tachycardic, regular rhythm. No murmurs.       Assessment & Plan    1. Epistaxis Now controlled with packing in left nostril. Was likely exacerbated by uncontrolled hypertension - Ambulatory referral to ENT  2. Malignant hypertension Now back on anti-hypertensive medications. He never had hydralazine dispensed, but does not appear to  need any additional medications.   3. Iron deficiency anemia due to chronic blood loss Advised to start taking OTC iron supplements daily   The entirety of the information documented in the History of Present Illness, Review of Systems and Physical Exam were personally obtained by me. Portions of this information were initially documented by Meyer Cory, CMA and reviewed by me for thoroughness and accuracy.      Lelon Huh, MD  [facility]

## 2019-01-31 DIAGNOSIS — I10 Essential (primary) hypertension: Secondary | ICD-10-CM | POA: Diagnosis not present

## 2019-01-31 DIAGNOSIS — R040 Epistaxis: Secondary | ICD-10-CM | POA: Diagnosis not present

## 2019-02-02 ENCOUNTER — Other Ambulatory Visit: Payer: Self-pay

## 2019-02-02 ENCOUNTER — Encounter: Payer: Self-pay | Admitting: Adult Health

## 2019-02-02 ENCOUNTER — Ambulatory Visit (INDEPENDENT_AMBULATORY_CARE_PROVIDER_SITE_OTHER): Payer: Medicaid Other | Admitting: Adult Health

## 2019-02-02 ENCOUNTER — Ambulatory Visit: Payer: Medicaid Other | Admitting: Family Medicine

## 2019-02-02 VITALS — BP 159/89 | HR 96 | Temp 97.1°F | Resp 16 | Wt 200.4 lb

## 2019-02-02 DIAGNOSIS — I43 Cardiomyopathy in diseases classified elsewhere: Secondary | ICD-10-CM | POA: Diagnosis not present

## 2019-02-02 DIAGNOSIS — E785 Hyperlipidemia, unspecified: Secondary | ICD-10-CM | POA: Diagnosis not present

## 2019-02-02 DIAGNOSIS — I119 Hypertensive heart disease without heart failure: Secondary | ICD-10-CM

## 2019-02-02 DIAGNOSIS — I10 Essential (primary) hypertension: Secondary | ICD-10-CM

## 2019-02-02 DIAGNOSIS — R040 Epistaxis: Secondary | ICD-10-CM | POA: Diagnosis not present

## 2019-02-02 MED ORDER — METOPROLOL SUCCINATE ER 50 MG PO TB24: 50.0000 mg | ORAL_TABLET | Freq: Every day | ORAL | 0 refills | Status: DC

## 2019-02-02 NOTE — Patient Instructions (Signed)
Stop metoprolol XL 25 mg and start new prescription as follows.  Placing a referral to cardiology. Metoprolol extended-release capsules What is this medicine? METOPROLOL (me TOE proe lole) is a beta-blocker. Beta-blockers reduce the workload on the heart and help it to beat more regularly. This medicine is used to treat high blood pressure and to prevent chest pain. It is also used after a heart attack to prevent an additional heart attack from occurring. This medicine may be used for other purposes; ask your health care provider or pharmacist if you have questions. COMMON BRAND NAME(S): KAPSPARGO What should I tell my health care provider before I take this medicine? They need to know if you have any of these conditions:  diabetes  heart disease  liver disease  lung or breathing disease, like asthma  pheochromocytoma  thyroid disease  an unusual or allergic reaction to metoprolol, other beta-blockers, medicines, foods, dyes, or preservatives  pregnant or trying to get pregnant  breast-feeding How should I use this medicine? Take this medicine by mouth. The capsules can be swallowed whole or opened carefully and the contents sprinkled over a small amount (teaspoonful) of soft food, such as applesauce, pudding, or yogurt. This mixture must be swallowed within 60 minutes and not stored for future use. Do not chew this medicine. You can take it with or without food. If it upsets your stomach, take it with food. Take your medicine at regular intervals. Do not take it more often than directed. Do not stop taking except on your doctor's advice. Talk to your pediatrician regarding the use of this medicine in children. While this drug may be prescribed for children as young as 6 for selected conditions, precautions do apply. Overdosage: If you think you have taken too much of this medicine contact a poison control center or emergency room at once. NOTE: This medicine is only for you. Do not  share this medicine with others. What if I miss a dose? If you miss a dose, take it as soon as you can. If it is almost time for your next dose, take only that dose. Do not take double or extra doses. What may interact with this medicine? This medicine may interact with the following medications:  certain medicines for blood pressure, heart disease, irregular heart beat  epinephrine  fluoxetine  MAOIs like Carbex, Eldepryl, Marplan, Nardil, and Parnate  paroxetine  reserpine This list may not describe all possible interactions. Give your health care provider a list of all the medicines, herbs, non-prescription drugs, or dietary supplements you use. Also tell them if you smoke, drink alcohol, or use illegal drugs. Some items may interact with your medicine. What should I watch for while using this medicine? You may get drowsy or dizzy. Do not drive, use machinery, or do anything that needs mental alertness until you know how this medicine affects you. Do not stand or sit up quickly, especially if you are an older patient. This reduces the risk of dizzy or fainting spells. Alcohol may interfere with the effect of this medicine. Avoid alcoholic drinks. Visit your doctor or health care professional for regular checks on your progress. Check your blood pressure as directed. Ask your doctor or health care professional what your blood pressure should be and when you should contact him or her. Do not treat yourself for coughs, colds, or pain while you are using this medicine without asking your doctor or health care professional for advice. Some ingredients may increase your blood pressure. This  medicine may increase blood sugar. Ask your healthcare provider if changes in diet or medicines are needed if you have diabetes. What side effects may I notice from receiving this medicine? Side effects that you should report to your doctor or health care professional as soon as possible:  allergic  reactions like skin rash, itching or hives, swelling of the face, lips, or tongue  cold hands or feet  signs and symptoms of high blood sugar such as being more thirsty or hungry or having to urinate more than normal. You may also feel very tired or have blurry vision.  signs and symptoms of low blood pressure like dizziness; feeling faint or lightheaded, falls; unusually weak or tired  signs of worsening heart failure like breathing problems, swelling in your legs and feet  suicidal thoughts or other mood changes  unusually slow heartbeat Side effects that usually do not require medical attention (report these to your doctor or health care professional if they continue or are bothersome):  anxious  change in sex drive or performance  diarrhea  headache  trouble sleeping  upset stomach This list may not describe all possible side effects. Call your doctor for medical advice about side effects. You may report side effects to FDA at 1-800-FDA-1088. Where should I keep my medicine? Keep out of the reach of children. Store at room temperature between 15 and 30 degrees C (59 and 86 degrees F). Throw away any unused medicine after the expiration date. NOTE: This sheet is a summary. It may not cover all possible information. If you have questions about this medicine, talk to your doctor, pharmacist, or health care provider.  2020 Elsevier/Gold Standard (2017-12-21 11:07:10)   Hypertension, Adult Hypertension is another name for high blood pressure. High blood pressure forces your heart to work harder to pump blood. This can cause problems over time. There are two numbers in a blood pressure reading. There is a top number (systolic) over a bottom number (diastolic). It is best to have a blood pressure that is below 120/80. Healthy choices can help lower your blood pressure, or you may need medicine to help lower it. What are the causes? The cause of this condition is not known. Some  conditions may be related to high blood pressure. What increases the risk?  Smoking.  Having type 2 diabetes mellitus, high cholesterol, or both.  Not getting enough exercise or physical activity.  Being overweight.  Having too much fat, sugar, calories, or salt (sodium) in your diet.  Drinking too much alcohol.  Having long-term (chronic) kidney disease.  Having a family history of high blood pressure.  Age. Risk increases with age.  Race. You may be at higher risk if you are African American.  Gender. Men are at higher risk than women before age 38. After age 32, women are at higher risk than men.  Having obstructive sleep apnea.  Stress. What are the signs or symptoms?  High blood pressure may not cause symptoms. Very high blood pressure (hypertensive crisis) may cause: ? Headache. ? Feelings of worry or nervousness (anxiety). ? Shortness of breath. ? Nosebleed. ? A feeling of being sick to your stomach (nausea). ? Throwing up (vomiting). ? Changes in how you see. ? Very bad chest pain. ? Seizures. How is this treated?  This condition is treated by making healthy lifestyle changes, such as: ? Eating healthy foods. ? Exercising more. ? Drinking less alcohol.  Your health care provider may prescribe medicine if lifestyle changes  are not enough to get your blood pressure under control, and if: ? Your top number is above 130. ? Your bottom number is above 80.  Your personal target blood pressure may vary. Follow these instructions at home: Eating and drinking   If told, follow the DASH eating plan. To follow this plan: ? Fill one half of your plate at each meal with fruits and vegetables. ? Fill one fourth of your plate at each meal with whole grains. Whole grains include whole-wheat pasta, brown rice, and whole-grain bread. ? Eat or drink low-fat dairy products, such as skim milk or low-fat yogurt. ? Fill one fourth of your plate at each meal with low-fat  (lean) proteins. Low-fat proteins include fish, chicken without skin, eggs, beans, and tofu. ? Avoid fatty meat, cured and processed meat, or chicken with skin. ? Avoid pre-made or processed food.  Eat less than 1,500 mg of salt each day.  Do not drink alcohol if: ? Your doctor tells you not to drink. ? You are pregnant, may be pregnant, or are planning to become pregnant.  If you drink alcohol: ? Limit how much you use to:  0-1 drink a day for women.  0-2 drinks a day for men. ? Be aware of how much alcohol is in your drink. In the [place] one drink equals one 12 oz bottle of beer (355 mL), one 5 oz glass of wine (148 mL), or one 1 oz glass of hard liquor (44 mL). Lifestyle   Work with your doctor to stay at a healthy weight or to lose weight. Ask your doctor what the best weight is for you.  Get at least 30 minutes of exercise most days of the week. This may include walking, swimming, or biking.  Get at least 30 minutes of exercise that strengthens your muscles (resistance exercise) at least 3 days a week. This may include lifting weights or doing Pilates.  Do not use any products that contain nicotine or tobacco, such as cigarettes, e-cigarettes, and chewing tobacco. If you need help quitting, ask your doctor.  Check your blood pressure at home as told by your doctor.  Keep all follow-up visits as told by your doctor. This is important. Medicines  Take over-the-counter and prescription medicines only as told by your doctor. Follow directions carefully.  Do not skip doses of blood pressure medicine. The medicine does not work as well if you skip doses. Skipping doses also puts you at risk for problems.  Ask your doctor about side effects or reactions to medicines that you should watch for. Contact a doctor if you:  Think you are having a reaction to the medicine you are taking.  Have headaches that keep coming back (recurring).  Feel dizzy.  Have swelling in your  ankles.  Have trouble with your vision. Get help right away if you:  Get a very bad headache.  Start to feel mixed up (confused).  Feel weak or numb.  Feel faint.  Have very bad pain in your: ? Chest. ? Belly (abdomen).  Throw up more than once.  Have trouble breathing. Summary  Hypertension is another name for high blood pressure.  High blood pressure forces your heart to work harder to pump blood.  For most people, a normal blood pressure is less than 120/80.  Making healthy choices can help lower blood pressure. If your blood pressure does not get lower with healthy choices, you may need to take medicine. This information is not intended  to replace advice given to you by your health care provider. Make sure you discuss any questions you have with your health care provider. Document Released: 08/19/2007 Document Revised: 11/10/2017 Document Reviewed: 11/10/2017 Elsevier Patient Education  2020 [employer].

## 2019-02-02 NOTE — Progress Notes (Addendum)
Patient: Terrance Carter Male    DOB: [birth year]   58 y.o.   MRN: [id number] Visit Date: 02/02/2019  Today's Provider: Marcille Buffy, FNP   Chief Complaint  Patient presents with  . Hypertension   Subjective:     HPI Patient presents in office today with concerns of fluctuating blood pressure for the past week.  He saw Dr. Tami Ribas today to remove packing in his left nare that was placed 01/15/2019 at the ER for epistaxis and was told his blood pressure was elevated 200 " over something he reports". He reports he was nervous and excited at that tinme to have it removed from his nose.  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    He reports taking his medication as prescribed. Denies any chest pain, any radiating to neck or jaw. Denies any headache.  He ate chinese food last pm a large amount.   Seeing the blood from his nose causes him to feel dizzy. Denies any syncope.Denies any current dizziness or lightheadedness.Denies any vision.   Patient  denies any fever, body aches,chills, rash, chest pain, shortness of breath, nausea, vomiting, or diarrhea.   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  s/p 2 cycles induction chemotherapy/radiation completed February 2013  Patient Active Problem List   Diagnosis Date Noted  . Hypertensive emergency 01/26/2019  . Hypertensive urgency 01/25/2019  . Hypertensive cardiomyopathy ([place]) 03/26/2017  . 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 Change in vision 11/23/2014  . Dysphagia 11/23/2014  . Heart disease 11/23/2014  . Hidradenitis suppurativa 11/23/2014  . 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 Keloid 11/23/2014  . 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  amLODipine (NORVASC) 10 MG tablet, Take 1 tablet (10 mg total) by mouth daily., Disp: 30 tablet, Rfl: 0 .  amoxicillin-clavulanate (AUGMENTIN) 875-125 MG tablet, Take 1 tablet by mouth 2 (two) times daily., Disp: 14 tablet, Rfl: 0 .  aspirin EC 81 MG tablet, Take 81 mg by mouth daily., Disp: , Rfl:  .  ferrous sulfate 325 (65 FE) MG EC tablet, Take 1 tablet (325 mg total) by mouth daily., Disp: , Rfl:  .  metoprolol succinate (TOPROL-XL) 25 MG 24 hr tablet, Take 0.5 tablets (12.5 mg total) by mouth daily., Disp: 15 tablet, Rfl: 11 .  nitroGLYCERIN (NITROSTAT) 0.4 MG SL tablet, Place 0.4 mg under the tongue every 5 (five) minutes as needed for chest pain., Disp: , Rfl:  .  oxymetazoline (AFRIN) 0.05 % nasal spray, Place 2 sprays into both nostrils 2 (two) times daily., Disp: , Rfl:  .  valsartan-hydrochlorothiazide (DIOVAN-HCT) 80-12.5 MG tablet, Take 1 tablet by mouth daily., Disp: 30 tablet, Rfl: 0 .  atorvastatin (LIPITOR) 20 MG tablet, Take 1 tablet (20 mg total) by mouth daily. (Patient not taking: Reported on 01/30/2019), Disp: 30 tablet, Rfl: 0 .  fluticasone (FLONASE) 50 MCG/ACT nasal spray, Place 2 sprays into both nostrils daily. (Patient not taking: Reported on 01/26/2019), Disp: 16 g, Rfl: 3  Review of Systems  Constitutional: Positive for fever. Negative for activity change, appetite change, chills, diaphoresis, fatigue and unexpected weight change.  HENT: Positive for nosebleeds. Negative for congestion, dental problem, drooling,  ear discharge, ear pain, facial swelling, hearing loss, mouth sores, postnasal drip, rhinorrhea, sinus pressure, sinus pain, sneezing, sore throat, tinnitus, trouble swallowing and voice change.   Respiratory: Negative.   Cardiovascular: Negative.   Gastrointestinal: Negative.   Genitourinary: Negative.   Musculoskeletal: Negative.   Skin: Negative for color change, pallor, rash and wound.  Neurological: Positive for dizziness (patient  states that this is due to nose bleeds ). Negative for tremors, seizures, syncope, facial asymmetry, speech difficulty, weakness, light-headedness, numbness and headaches.  Psychiatric/Behavioral: Negative.     Social History   Tobacco Use  . Smoking status: Former Smoker    Packs/day: 1.00    Years: 20.00    Pack years: 20.00  . Smokeless tobacco: Never Used  Substance Use Topics  . Alcohol use: Yes    Alcohol/week: 0.0 standard drinks    Comment: Drinks wine once a month      Objective:   BP (!) 167/77   Pulse 99   Temp (!) 97.1 F (36.2 C) (Oral)   Resp 16   Wt 200 lb 6.4 oz (90.9 kg)   BMI 31.39 kg/m  Vitals:   02/02/19 1614  BP: (!) 167/77  Pulse: 99  Resp: 16  Temp: (!) 97.1 F (36.2 C)  TempSrc: Oral  Weight: 200 lb 6.4 oz (90.9 kg)  Body mass index is 31.39 kg/m. Temporal thermometer.   Blood pressure (!) 159/89, pulse 96, temperature (!) 97.1 F (36.2 C), temperature source Oral, resp. rate 16, weight 200 lb 6.4 oz (90.9 kg). repeat pressure.  Physical Exam Vitals signs reviewed.  Constitutional:      General: He is not in acute distress.    Appearance: Normal appearance. He is not ill-appearing, toxic-appearing or diaphoretic.  HENT:     Head: Normocephalic and atraumatic.     Right Ear: Hearing normal.     Left Ear: Hearing normal.     Nose: No nasal deformity, nasal tenderness, congestion or rhinorrhea.     Right Nostril: No epistaxis.     Left Nostril: Epistaxis (small amount / from removal of paking at ENT today. ) present.     Mouth/Throat:     Mouth: Mucous membranes are moist.  Eyes:     Extraocular Movements: Extraocular movements intact.     Conjunctiva/sclera: Conjunctivae normal.     Pupils: Pupils are equal, round, and reactive to light.  Neck:     Musculoskeletal: Normal range of motion and neck supple. No neck rigidity or muscular tenderness.     Vascular: No carotid bruit.  Cardiovascular:     Rate and Rhythm: Regular rhythm.  Tachycardia present.     Pulses: Normal pulses.          Carotid pulses are 2+ on the right side and 2+ on the left side.      Radial pulses are 2+ on the right side and 2+ on the left side.       Femoral pulses are 2+ on the right side and 2+ on the left side.      Popliteal pulses are 2+ on the right side and 2+ on the left side.       Dorsalis pedis pulses are 2+ on the right side and 2+ on the left side.       Posterior tibial pulses are 2+ on the right side and 2+ on the left side.     Heart sounds: Normal heart sounds. No murmur. No friction rub. No gallop.  Comments: Questionable intermittent bruit carotid left  Pulmonary:     Effort: Pulmonary effort is normal. No respiratory distress.     Breath sounds: Normal breath sounds. No stridor. No wheezing, rhonchi or rales.  Chest:     Chest wall: No tenderness.  Abdominal:     General: Bowel sounds are normal. There is no distension or abdominal bruit.     Palpations: Abdomen is soft. There is no mass or pulsatile mass.     Tenderness: There is no abdominal tenderness. There is no right CVA tenderness or guarding.     Comments: Round   Musculoskeletal: Normal range of motion.     Right lower leg: No edema.     Left lower leg: No edema.  Lymphadenopathy:     Cervical: No cervical adenopathy.  Skin:    General: Skin is warm and dry.     Capillary Refill: Capillary refill takes less than 2 seconds.     Nails: There is no clubbing.   Neurological:     Mental Status: He is alert and oriented to person, place, and time.     Cranial Nerves: Cranial nerves are intact.     Sensory: Sensation is intact.     Coordination: Coordination is intact.     Gait: Gait is intact.     Deep Tendon Reflexes: Reflexes are normal and symmetric.  Psychiatric:        Attention and Perception: Attention normal.        Mood and Affect: Mood normal.        Behavior: Behavior normal. Behavior is cooperative.        Thought Content: Thought content  normal.        Judgment: Judgment normal.      No results found for any visits on 02/02/19.     Assessment & Plan    1. Essential hypertension Medications Discontinued During This Encounter  Medication Reason  . metoprolol succinate (TOPROL-XL) 25 MG 24 hr tablet Discontinued by provider   Meds ordered this encounter  Medications  . metoprolol succinate (TOPROL-XL) 50 MG 24 hr tablet    Sig: Take 1 tablet (50 mg total) by mouth daily. Take with or immediately following a meal.    Dispense:  90 tablet    Refill:  0   - EKG 12-Lead - Ambulatory referral to Cardiology- urgent  Mild elevated troponin in Emergency room November 2020.  No cardiac pain today, EKG reviewed with Dr. Rosanna Randy and plan to increase Toprol, and refer to cardiology of patient choice.  Discussed RED flags and when to seek emergency medical treatment.  2. Cardiomyopathy due to hypertension, without heart failure [facility]) - Ambulatory referral to Cardiology Reviewed history.  3. Malignant hypertension - Ambulatory referral to Cardiology  4. Hyperlipidemia, unspecified hyperlipidemia type - Ambulatory referral to Cardiology   Return in about 1 week (around 02/09/2019), or with Dr. Caryn Section sooner if needed., for Go to Emergency room/ urgent care if worse, Call 911 for emergencies.    Advised patient call the office or your primary care doctor for an appointment if no improvement within 72 hours or if any symptoms change or worsen at any time  Advised ER or urgent Care if after hours or on weekend. Call 911 for emergency symptoms at any time.Patinet verbalized understanding of all instructions given/reviewed and treatment plan and has no further questions or concerns at this time.      Marcille Buffy, [facility]

## 2019-02-15 ENCOUNTER — Other Ambulatory Visit: Payer: Self-pay | Admitting: Adult Health

## 2019-02-15 ENCOUNTER — Telehealth: Payer: Self-pay | Admitting: Family Medicine

## 2019-02-15 DIAGNOSIS — I10 Essential (primary) hypertension: Secondary | ICD-10-CM

## 2019-02-15 MED ORDER — VALSARTAN-HYDROCHLOROTHIAZIDE 80-12.5 MG PO TABS: 1.0000 | ORAL_TABLET | Freq: Every day | ORAL | 0 refills | Status: DC

## 2019-02-15 MED ORDER — METOPROLOL SUCCINATE ER 50 MG PO TB24: 50.0000 mg | ORAL_TABLET | Freq: Every day | ORAL | 0 refills | Status: DC

## 2019-02-15 MED ORDER — AMLODIPINE BESYLATE 10 MG PO TABS: 10.0000 mg | ORAL_TABLET | Freq: Every day | ORAL | 0 refills | Status: DC

## 2019-02-15 NOTE — Progress Notes (Signed)
Meds ordered this encounter  Medications  . amLODipine (NORVASC) 10 MG tablet    Sig: Take 1 tablet (10 mg total) by mouth daily.    Dispense:  90 tablet    Refill:  0  . metoprolol succinate (TOPROL-XL) 50 MG 24 hr tablet    Sig: Take 1 tablet (50 mg total) by mouth daily. Take with or immediately following a meal.    Dispense:  90 tablet    Refill:  0   Refilled above. Keep cardiology appointment 02/23/2019.

## 2019-02-15 NOTE — Progress Notes (Signed)
Meds ordered this encounter  Medications  . valsartan-hydrochlorothiazide (DIOVAN-HCT) 80-12.5 MG tablet    Sig: Take 1 tablet by mouth daily.    Dispense:  90 tablet    Refill:  0    REPLACES LOSARTAN-HCTZ   Refill given 90 day supply. He has cardiology appt f/u on 02/23/2019.

## 2019-02-15 NOTE — Telephone Encounter (Signed)
Medication Refill - Medication: valsartan-hydrochlorothiazide (DIOVAN-HCT) 80-12.5 MG tablet [id number]  Pt last rec'd these in the ER  amLODipine (NORVASC) 10 MG tablet [id number]  metoprolol succinate (TOPROL-XL) 50 MG 24 hr tablet    Has the patient contacted their pharmacy? No. (Agent: If no, request that the patient contact the pharmacy for the refill.) (Agent: If yes, when and what did the pharmacy advise?)  Preferred Pharmacy (with phone number or street name):  [facility] [id number], [place] [phone] (Phone)     Agent: Please be advised that RX refills may take up to 3 business days. We ask that you follow-up with your pharmacy.

## 2019-02-15 NOTE — Telephone Encounter (Signed)
Patient has been advised. KW 

## 2019-02-15 NOTE — Progress Notes (Signed)
1st script printed. Discarded and sent electronically.  Meds ordered this encounter  Medications  . amLODipine (NORVASC) 10 MG tablet    Sig: Take 1 tablet (10 mg total) by mouth daily.    Dispense:  90 tablet    Refill:  0  . valsartan-hydrochlorothiazide (DIOVAN-HCT) 80-12.5 MG tablet    Sig: Take 1 tablet by mouth daily.    Dispense:  90 tablet    Refill:  0    REPLACES LOSARTAN-HCTZ

## 2019-02-15 NOTE — Telephone Encounter (Signed)
Please make patient aware.  Provider refilled a 90 day supply, please advise him to keep his cardiology appointment 02/23/2019 and monitor blood pressure and heart rate report any high or low readings given hypotension/ hypertension parameters. Keep follow up with Dr. Caryn Section as well. Let me know if any questions.   Medication Refill - Medication: valsartan-hydrochlorothiazide (DIOVAN-HCT) 80-12.5 MG tablet [id number]  Pt last rec'd these in the ER  amLODipine (NORVASC) 10 MG tablet [id number]  metoprolol succinate (TOPROL-XL) 50 MG 24 hr tablet    Has the patient contacted their pharmacy? No. (Agent: If no, request that the patient contact the pharmacy for the refill.) (Agent: If yes, when and what did the pharmacy advise?)  Preferred Pharmacy (with phone number or street name):  [facility] [id number], [place] [phone] (Phone)     Agent: Please be advised that RX refills may take up to 3 business days. We ask that you follow-up with your pharmacy.

## 2019-02-16 DIAGNOSIS — I10 Essential (primary) hypertension: Secondary | ICD-10-CM | POA: Diagnosis not present

## 2019-02-16 DIAGNOSIS — R040 Epistaxis: Secondary | ICD-10-CM | POA: Diagnosis not present

## 2019-02-23 ENCOUNTER — Ambulatory Visit: Payer: Medicaid Other | Admitting: Cardiology

## 2019-04-26 DIAGNOSIS — R040 Epistaxis: Secondary | ICD-10-CM | POA: Diagnosis not present

## 2019-04-26 DIAGNOSIS — I10 Essential (primary) hypertension: Secondary | ICD-10-CM | POA: Diagnosis not present

## 2019-05-09 ENCOUNTER — Other Ambulatory Visit: Payer: Self-pay | Admitting: Family Medicine

## 2019-05-09 DIAGNOSIS — I10 Essential (primary) hypertension: Secondary | ICD-10-CM

## 2019-05-09 MED ORDER — METOPROLOL SUCCINATE ER 50 MG PO TB24: 50.0000 mg | ORAL_TABLET | Freq: Every day | ORAL | 0 refills | Status: DC

## 2019-05-09 MED ORDER — AMLODIPINE BESYLATE 10 MG PO TABS: 10.0000 mg | ORAL_TABLET | Freq: Every day | ORAL | 0 refills | Status: DC

## 2019-05-09 MED ORDER — VALSARTAN-HYDROCHLOROTHIAZIDE 80-12.5 MG PO TABS: 1.0000 | ORAL_TABLET | Freq: Every day | ORAL | 0 refills | Status: DC

## 2019-05-09 NOTE — Telephone Encounter (Signed)
Requested Prescriptions  Pending Prescriptions Disp Refills  . metoprolol succinate (TOPROL-XL) 50 MG 24 hr tablet 90 tablet 0    Sig: Take 1 tablet (50 mg total) by mouth daily. Take with or immediately following a meal.     Cardiovascular:  Beta Blockers Failed - 05/09/2019  2:43 PM      Failed - Last BP in normal range    BP Readings from Last 1 Encounters:  02/02/19 (!) 159/89         Passed - Last Heart Rate in normal range    Pulse Readings from Last 1 Encounters:  02/02/19 96         Passed - Valid encounter within last 6 months    Recent Outpatient Visits          3 months ago Cardiomyopathy due to hypertension, without heart failure ([place])   [place], Kelby Aline, FNP   3 months ago Epistaxis   [facility] Birdie Sons, MD   1 year ago Cardiomyopathy due to hypertension, without heart failure [facility])   [facility] Birdie Sons, MD   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 Birdie Sons, MD   2 years ago Chest pain, unspecified type   [facility] Birdie Sons, MD             . amLODipine (NORVASC) 10 MG tablet 90 tablet 0    Sig: Take 1 tablet (10 mg total) by mouth daily.     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 valsartan-hydrochlorothiazide (DIOVAN-HCT) 80-12.5 MG tablet 90 tablet 0    Sig: Take 1 tablet by mouth daily.     Cardiovascular: ARB + Diuretic Combos Failed - 05/09/2019  2:43 PM      Failed - Last BP in normal range    BP Readings from Last 1 Encounters:  02/02/19 (!) 159/89         Passed - K in normal range and within 180 days    Potassium  Date Value Ref Range Status  01/26/2019 4.2 3.5 - 5.1 mmol/L Final  02/11/2013 3.7 3.5 - 5.1 mmol/L Final         Passed - Na in normal range and within 180 days    Sodium  Date Value Ref Range Status  01/26/2019 137 135 - 145 mmol/L Final  03/31/2017 140 134 - 144 mmol/L Final  02/11/2013 132 (L) 136 - 145 mmol/L Final  Passed - Cr in normal range and within 180 days    Creatinine  Date Value Ref Range Status  02/21/2014 1.15 0.60 - 1.30 mg/dL Final   Creatinine, Ser  Date Value Ref Range Status  01/26/2019 1.10 0.61 - 1.24 mg/dL Final         Passed - Ca in normal range and within 180 days    Calcium  Date Value Ref Range Status  01/26/2019 8.9 8.9 - 10.3 mg/dL Final   Calcium, Total  Date Value Ref Range Status  02/21/2014 8.7 8.5 - 10.1 mg/dL Final         Passed - Patient is not pregnant      Passed - Valid encounter within last 6 months    Recent Outpatient Visits          3 months ago Cardiomyopathy due to hypertension, without heart failure ([place])   [place], Kelby Aline, FNP   3 months ago Epistaxis   [facility] Birdie Sons, MD   1 year ago Cardiomyopathy due to hypertension, without heart failure [facility])   [facility] Birdie Sons, MD   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

## 2019-05-09 NOTE — Telephone Encounter (Signed)
Medication Refill - Medication: metoprolol succinate (TOPROL-XL) 50 MG 24 hr tablet/valsartan-hydrochlorothiazide (DIOVAN-HCT) 80-12.5 MG tablet /amLODipine (NORVASC) 10 MG tablet   Has the patient contacted their pharmacy? Yes.   (Agent: If no, request that the patient contact the pharmacy for the refill.) (Agent: If yes, when and what did the pharmacy advise?)  Preferred Pharmacy (with phone number or street name):  [facility] [id number], [place] Phone:  [phone]  Fax:  [phone]       Agent: Please be advised that RX refills may take up to 3 business days. We ask that you follow-up with your pharmacy.

## 2019-09-07 ENCOUNTER — Other Ambulatory Visit: Payer: Self-pay | Admitting: Family Medicine

## 2019-09-07 ENCOUNTER — Telehealth: Payer: Self-pay | Admitting: *Deleted

## 2019-09-07 DIAGNOSIS — I10 Essential (primary) hypertension: Secondary | ICD-10-CM

## 2019-09-07 MED ORDER — METOPROLOL SUCCINATE ER 50 MG PO TB24: 50.0000 mg | ORAL_TABLET | Freq: Every day | ORAL | 0 refills | Status: DC

## 2019-09-07 MED ORDER — VALSARTAN-HYDROCHLOROTHIAZIDE 80-12.5 MG PO TABS: 1.0000 | ORAL_TABLET | Freq: Every day | ORAL | 0 refills | Status: DC

## 2019-09-07 MED ORDER — AMLODIPINE BESYLATE 10 MG PO TABS: 10.0000 mg | ORAL_TABLET | Freq: Every day | ORAL | 0 refills | Status: DC

## 2019-09-07 NOTE — Telephone Encounter (Signed)
Patient requesting valsartan-hydrochlorothiazide (DIOVAN-HCT) 80-12.5 MG tablet, metoprolol succinate (TOPROL-XL) 50 MG 24 hr tablet and amLODipine (NORVASC) 10 MG tablet  , informed patient please allow 48 to 72 hour turn around time  [place], [place] Phone:  [phone]  Fax:  [phone]

## 2019-09-07 NOTE — Telephone Encounter (Signed)
I attempted to call pt to let him know he needs an appt for a refill on his medications.   "Voicemail has not been set up yet"  30 day supply given of the Valsartan, metoprolol, and amlodipine. Notification sent to [facility].

## 2020-01-22 NOTE — Progress Notes (Signed)
Established patient visit   Patient: Terrance Carter   DOB: [birth year]   59 y.o. Male  MRN: [id number] Visit Date: 01/23/2020  Today's healthcare provider: Lelon Huh, MD   Chief Complaint  Patient presents with  . Hypertension   Subjective    HPI  Hypertension, follow-up  BP Readings from Last 3 Encounters:  01/23/20 (!) 249/132  02/02/19 (!) 159/89  01/30/19 130/64   Wt Readings from Last 3 Encounters:  01/23/20 209 lb 3.2 oz (94.9 kg)  02/02/19 200 lb 6.4 oz (90.9 kg)  01/30/19 196 lb (88.9 kg)     He was last seen for hypertension 02/02/2019 (seen by Sharyn Lull Flinchum, PA-C) BP at that visit was 159/89. Management since that visit includes increasing Toprol to 50mg  daily and referring to cardiology.  He reports poor compliance with treatment.  He is having side effects. Has had tightness in his chest since increasing Metoprolol to 50mg  . Patient states that he cut back down to 25mg  daily of Metoprolol because he thinks it caused him to have chest pains. He has been out of Amlodipine for several weeks. He was also changed from losartan-hctz to valsartan-hctz but has apparently been out of those as well for a few months.   He is following a Regular diet. He is not exercising. He does not smoke.  Use of agents associated with hypertension: NSAIDS.   Outside blood pressures are not checked. Symptoms: Yes chest pain No chest pressure  Yes palpitations No syncope  No dyspnea No orthopnea  No paroxysmal nocturnal dyspnea No lower extremity edema   He states he does have brief episodes of chest tightness of a few times a day. He does have history of angina and previously followed by Dr. Clayborn Bigness who he has not seen since 2019. He was prescribed nitroglycerine, but has also been out of that for a long period of time.   Pertinent labs: Lab Results  Component Value Date   CHOL 175 03/31/2017   HDL 51 03/31/2017   LDLCALC 101 (H) 03/31/2017   TRIG 115 03/31/2017     CHOLHDL 3.4 03/31/2017   Lab Results  Component Value Date   NA 137 01/26/2019   K 4.2 01/26/2019   CREATININE 1.10 01/26/2019   GFRNONAA >60 01/26/2019   GFRAA >60 01/26/2019   GLUCOSE 119 (H) 01/26/2019     The ASCVD Risk score (Goff DC Jr., et al., 2013) failed to calculate for the following reasons:   The valid systolic blood pressure range is 90 to 200 mmHg   ---------------------------------------------------------------------------------------------------     Medications: Outpatient Medications Prior to Visit  Medication Sig  . aspirin EC 81 MG tablet Take 81 mg by mouth daily.  . fluticasone (FLONASE) 50 MCG/ACT nasal spray Place 2 sprays into both nostrils daily.  . metoprolol succinate (TOPROL-XL) 50 MG 24 hr tablet Take 1 tablet (50 mg total) by mouth daily. Take with or immediately following a meal. (Patient taking differently: Take 25 mg by mouth daily. Take with or immediately following a meal.)  . nitroGLYCERIN (NITROSTAT) 0.4 MG SL tablet Place 0.4 mg under the tongue every 5 (five) minutes as needed for chest pain.  Marland Kitchen amLODipine (NORVASC) 10 MG tablet Take 1 tablet (10 mg total) by mouth daily. (Patient not taking: Reported on 01/23/2020)  . atorvastatin (LIPITOR) 20 MG tablet Take 1 tablet (20 mg total) by mouth daily. (Patient not taking: Reported on 01/30/2019)  . oxymetazoline (AFRIN) 0.05 % nasal spray  Place 2 sprays into both nostrils 2 (two) times daily. (Patient not taking: Reported on 01/23/2020)  . valsartan-hydrochlorothiazide (DIOVAN-HCT) 80-12.5 MG tablet Take 1 tablet by mouth daily. (Patient not taking: Reported on 01/23/2020)  . [DISCONTINUED] amoxicillin-clavulanate (AUGMENTIN) 875-125 MG tablet Take 1 tablet by mouth 2 (two) times daily.  . [DISCONTINUED] ferrous sulfate 325 (65 FE) MG EC tablet Take 1 tablet (325 mg total) by mouth daily. (Patient not taking: Reported on 01/23/2020)   No facility-administered medications prior to visit.    Review  of Systems  Constitutional: Negative for appetite change, chills and fever.  Respiratory: Negative for chest tightness, shortness of breath and wheezing.   Cardiovascular: Positive for chest pain (tightness). Negative for palpitations.  Gastrointestinal: Negative for abdominal pain, nausea and vomiting.      Objective    BP (!) 249/132 (BP Location: Right Arm, Patient Position: Sitting, Cuff Size: Large)   Pulse 90   Temp 98.2 F (36.8 C) (Oral)   Resp 16   Wt 209 lb 3.2 oz (94.9 kg)   BMI 32.77 kg/m    Physical Exam    General: Appearance:    Mildly obese male in no acute distress  Eyes:    PERRL, conjunctiva/corneas clear, EOM's intact       Lungs:     Clear to auscultation bilaterally, respirations unlabored  Heart:    Normal heart rate. Normal rhythm. No murmurs, rubs, or gallops. No edema.   MS:   All extremities are intact.   Neurologic:   Awake, alert, oriented x 3. No apparent focal neurological           defect.       EKG: Non-specific T-wave abnormality.    Assessment & Plan     1. Accelerated hypertension Poor compliance with medications and follow up. Counseled on importance of keeping blood pressure under control. Is currently only taking 1/2 tablet of metoprolol per day. Advised to go immediately to pharmacy and start back on amlodipine and valstartan-hctz. Will need follow up to check blood pressure in 1-2 weeks.  - EKG 12-Lead - metoprolol succinate (TOPROL-XL) 50 MG 24 hr tablet; Take 0.5 tablets (25 mg total) by mouth daily. Take with or immediately following a meal.  Dispense: 30 tablet; Refill: 1 - valsartan-hydrochlorothiazide (DIOVAN-HCT) 80-12.5 MG tablet; Take 1 tablet by mouth daily.  Dispense: 30 tablet; Refill: 0 - amLODipine (NORVASC) 5 MG tablet; Take 1 tablet (5 mg total) by mouth daily.  Dispense: 30 tablet; Refill: 1  2. Chest tightness No active pain during today's visit. Previous work up by Dr. Clayborn Bigness with no sign of atherosclerotic heart  disease. Likely has demand ischemia related to accelerated hypertension. Advised to immediately start back on his antihypertensive medications. refill- nitroGLYCERIN (NITROSTAT) 0.4 MG SL tablet; Place 1 tablet (0.4 mg total) under the tongue every 5 (five) minutes as needed for chest pain (Up to 3 tablets).  Dispense: 50 tablet; Refill: 3 which he has been out for an extended period of times.  Is to go to ER if any new sx develop or if he has chest pain that does not resolve with nitroglycerine.   3. Cardiomyopathy due to hypertension, without heart failure ([place])  Flu and covid vaccines deferred today due to accelerated hypertension.        The entirety of the information documented in the History of Present Illness, Review of Systems and Physical Exam were personally obtained by me. Portions of this information were initially documented by  the CMA and reviewed by me for thoroughness and accuracy.      Lelon Huh, MD  [facility] [phone] (phone) [phone] (fax)  [place]

## 2020-01-23 ENCOUNTER — Ambulatory Visit: Payer: Medicaid Other | Admitting: Family Medicine

## 2020-01-23 ENCOUNTER — Encounter: Payer: Self-pay | Admitting: Family Medicine

## 2020-01-23 ENCOUNTER — Other Ambulatory Visit: Payer: Self-pay

## 2020-01-23 VITALS — BP 249/132 | HR 90 | Temp 98.2°F | Resp 16 | Wt 209.2 lb

## 2020-01-23 DIAGNOSIS — I119 Hypertensive heart disease without heart failure: Secondary | ICD-10-CM | POA: Diagnosis not present

## 2020-01-23 DIAGNOSIS — R0789 Other chest pain: Secondary | ICD-10-CM

## 2020-01-23 DIAGNOSIS — I43 Cardiomyopathy in diseases classified elsewhere: Secondary | ICD-10-CM

## 2020-01-23 DIAGNOSIS — I10 Essential (primary) hypertension: Secondary | ICD-10-CM | POA: Insufficient documentation

## 2020-01-23 MED ORDER — NITROGLYCERIN 0.4 MG SL SUBL: 0.4000 mg | SUBLINGUAL_TABLET | SUBLINGUAL | 3 refills | Status: AC | PRN

## 2020-01-23 MED ORDER — METOPROLOL SUCCINATE ER 50 MG PO TB24: 25.0000 mg | ORAL_TABLET | Freq: Every day | ORAL | 1 refills | Status: DC

## 2020-01-23 MED ORDER — VALSARTAN-HYDROCHLOROTHIAZIDE 80-12.5 MG PO TABS: 1.0000 | ORAL_TABLET | Freq: Every day | ORAL | 0 refills | Status: DC

## 2020-01-23 MED ORDER — AMLODIPINE BESYLATE 5 MG PO TABS: 5.0000 mg | ORAL_TABLET | Freq: Every day | ORAL | 1 refills | Status: DC

## 2020-02-06 ENCOUNTER — Ambulatory Visit: Payer: Medicaid Other | Admitting: Family Medicine

## 2020-02-06 ENCOUNTER — Encounter: Payer: Self-pay | Admitting: Family Medicine

## 2020-02-06 ENCOUNTER — Other Ambulatory Visit: Payer: Self-pay

## 2020-02-06 VITALS — BP 170/78 | HR 83 | Temp 98.4°F | Resp 16 | Wt 210.0 lb

## 2020-02-06 DIAGNOSIS — E669 Obesity, unspecified: Secondary | ICD-10-CM | POA: Diagnosis not present

## 2020-02-06 DIAGNOSIS — I10 Essential (primary) hypertension: Secondary | ICD-10-CM | POA: Diagnosis not present

## 2020-02-06 DIAGNOSIS — Z6832 Body mass index (BMI) 32.0-32.9, adult: Secondary | ICD-10-CM | POA: Diagnosis not present

## 2020-02-06 MED ORDER — AMLODIPINE BESYLATE 10 MG PO TABS: 10.0000 mg | ORAL_TABLET | Freq: Every day | ORAL | 5 refills | Status: DC

## 2020-02-06 NOTE — Patient Instructions (Addendum)
. Please go to the lab draw station in [address] on the second floor of [facility] . Normal hours are 8:00am to 11:30am and 1:00pm to 4:00pm Monday through Friday   Increase amlodipine to 10mg  every day. You can take 2 of the 5mg  tablets a day until they run out, then take 1 of the 10mg  tablets a day  DASH Eating Plan DASH stands for "Dietary Approaches to Stop Hypertension." The DASH eating plan is a healthy eating plan that has been shown to reduce high blood pressure (hypertension). It may also reduce your risk for type 2 diabetes, heart disease, and stroke. The DASH eating plan may also help with weight loss. What are tips for following this plan?  General guidelines  Avoid eating more than 2,300 mg (milligrams) of salt (sodium) a day. If you have hypertension, you may need to reduce your sodium intake to 1,500 mg a day.  Limit alcohol intake to no more than 1 drink a day for nonpregnant women and 2 drinks a day for men. One drink equals 12 oz of beer, 5 oz of wine, or 1 oz of hard liquor.  Work with your health care provider to maintain a healthy body weight or to lose weight. Ask what an ideal weight is for you.  Get at least 30 minutes of exercise that causes your heart to beat faster (aerobic exercise) most days of the week. Activities may include walking, swimming, or biking.  Work with your health care provider or diet and nutrition specialist (dietitian) to adjust your eating plan to your individual calorie needs. Reading food labels   Check food labels for the amount of sodium per serving. Choose foods with less than 5 percent of the Daily Value of sodium. Generally, foods with less than 300 mg of sodium per serving fit into this eating plan.  To find whole grains, look for the word "whole" as the first word in the ingredient list. Shopping  Buy products labeled as "low-sodium" or "no salt added."  Buy fresh foods. Avoid canned foods and premade or frozen  meals. Cooking  Avoid adding salt when cooking. Use salt-free seasonings or herbs instead of table salt or sea salt. Check with your health care provider or pharmacist before using salt substitutes.  Do not fry foods. Cook foods using healthy methods such as baking, boiling, grilling, and broiling instead.  Cook with heart-healthy oils, such as olive, canola, soybean, or sunflower oil. Meal planning  Eat a balanced diet that includes: ? 5 or more servings of fruits and vegetables each day. At each meal, try to fill half of your plate with fruits and vegetables. ? Up to 6-8 servings of whole grains each day. ? Less than 6 oz of lean meat, poultry, or fish each day. A 3-oz serving of meat is about the same size as a deck of cards. One egg equals 1 oz. ? 2 servings of low-fat dairy each day. ? A serving of nuts, seeds, or beans 5 times each week. ? Heart-healthy fats. Healthy fats called Omega-3 fatty acids are found in foods such as flaxseeds and coldwater fish, like sardines, salmon, and mackerel.  Limit how much you eat of the following: ? Canned or prepackaged foods. ? Food that is high in trans fat, such as fried foods. ? Food that is high in saturated fat, such as fatty meat. ? Sweets, desserts, sugary drinks, and other foods with added sugar. ? Full-fat dairy products.  Do not  salt foods before eating.  Try to eat at least 2 vegetarian meals each week.  Eat more home-cooked food and less restaurant, buffet, and fast food.  When eating at a restaurant, ask that your food be prepared with less salt or no salt, if possible. What foods are recommended? The items listed may not be a complete list. Talk with your dietitian about what dietary choices are best for you. Grains Whole-grain or whole-wheat bread. Whole-grain or whole-wheat pasta. Brown rice. Terrance Carter. Bulgur. Whole-grain and low-sodium cereals. Pita bread. Low-fat, low-sodium crackers. Whole-wheat flour  tortillas. Vegetables Fresh or frozen vegetables (raw, steamed, roasted, or grilled). Low-sodium or reduced-sodium tomato and vegetable juice. Low-sodium or reduced-sodium tomato sauce and tomato paste. Low-sodium or reduced-sodium canned vegetables. Fruits All fresh, dried, or frozen fruit. Canned fruit in natural juice (without added sugar). Meat and other protein foods Skinless chicken or [place]. Ground chicken or [place]. Pork with fat trimmed off. Fish and seafood. Egg whites. Dried beans, peas, or lentils. Unsalted nuts, nut butters, and seeds. Unsalted canned beans. Lean cuts of beef with fat trimmed off. Low-sodium, lean deli meat. Dairy Low-fat (1%) or fat-free (skim) milk. Fat-free, low-fat, or reduced-fat cheeses. Nonfat, low-sodium ricotta or cottage cheese. Low-fat or nonfat yogurt. Low-fat, low-sodium cheese. Fats and oils Soft margarine without trans fats. Vegetable oil. Low-fat, reduced-fat, or light mayonnaise and salad dressings (reduced-sodium). Canola, safflower, olive, soybean, and sunflower oils. Avocado. Seasoning and other foods Herbs. Spices. Seasoning mixes without salt. Unsalted popcorn and pretzels. Fat-free sweets. What foods are not recommended? The items listed may not be a complete list. Talk with your dietitian about what dietary choices are best for you. Grains Baked goods made with fat, such as croissants, muffins, or some breads. Dry pasta or rice meal packs. Vegetables Creamed or fried vegetables. Vegetables in a cheese sauce. Regular canned vegetables (not low-sodium or reduced-sodium). Regular canned tomato sauce and paste (not low-sodium or reduced-sodium). Regular tomato and vegetable juice (not low-sodium or reduced-sodium). Terrance Carter. Olives. Fruits Canned fruit in a light or heavy syrup. Fried fruit. Fruit in cream or butter sauce. Meat and other protein foods Fatty cuts of meat. Ribs. Fried meat. Terrance Carter. Sausage. Bologna and other processed lunch meats.  Salami. Fatback. Hotdogs. Bratwurst. Salted nuts and seeds. Canned beans with added salt. Canned or smoked fish. Whole eggs or egg yolks. Chicken or [place] with skin. Dairy Whole or 2% milk, cream, and half-and-half. Whole or full-fat cream cheese. Whole-fat or sweetened yogurt. Full-fat cheese. Nondairy creamers. Whipped toppings. Processed cheese and cheese spreads. Fats and oils Butter. Stick margarine. Lard. Shortening. Ghee. Bacon fat. Tropical oils, such as coconut, palm kernel, or palm oil. Seasoning and other foods Salted popcorn and pretzels. Onion salt, garlic salt, seasoned salt, table salt, and sea salt. Worcestershire sauce. Tartar sauce. Barbecue sauce. Teriyaki sauce. Soy sauce, including reduced-sodium. Steak sauce. Canned and packaged gravies. Fish sauce. Oyster sauce. Cocktail sauce. Horseradish that you find on the shelf. Ketchup. Mustard. Meat flavorings and tenderizers. Bouillon cubes. Hot sauce and Tabasco sauce. Premade or packaged marinades. Premade or packaged taco seasonings. Relishes. Regular salad dressings. Where to find more information:  National Heart, Lung, and [place]: https://wilson-eaton.com/  American Heart Association: www.heart.org Summary  The DASH eating plan is a healthy eating plan that has been shown to reduce high blood pressure (hypertension). It may also reduce your risk for type 2 diabetes, heart disease, and stroke.  With the DASH eating plan, you should limit salt (sodium) intake to  2,300 mg a day. If you have hypertension, you may need to reduce your sodium intake to 1,500 mg a day.  When on the DASH eating plan, aim to eat more fresh fruits and vegetables, whole grains, lean proteins, low-fat dairy, and heart-healthy fats.  Work with your health care provider or diet and nutrition specialist (dietitian) to adjust your eating plan to your individual calorie needs. This information is not intended to replace advice given to you by your health  care provider. Make sure you discuss any questions you have with your health care provider. Document Revised: 02/12/2017 Document Reviewed: 02/24/2016 Elsevier Patient Education  2020 [employer].

## 2020-02-06 NOTE — Progress Notes (Signed)
Established patient visit   Patient: Terrance Carter   DOB: 07/23/1960   59 y.o. Male  MRN: [id number] Visit Date: 02/06/2020  Today's healthcare provider: Lelon Huh, MD   Chief Complaint  Patient presents with  . Hypertension   Subjective    HPI   Accelerated Hypertension, follow-up  BP Readings from Last 3 Encounters:  02/06/20 (!) 181/79  01/23/20 (!) 249/132  02/02/19 (!) 159/89   Wt Readings from Last 3 Encounters:  02/06/20 210 lb (95.3 kg)  01/23/20 209 lb 3.2 oz (94.9 kg)  02/02/19 200 lb 6.4 oz (90.9 kg)     He was last seen for hypertension 01/23/2020.  BP at that visit was 159/89. It was noted that patient had poor compliance with medications and follow up. Counseled on importance of keeping blood pressure under control. He was only taking 1/2 tablet metoprolol every day. . Advised to go immediately to pharmacy and start back on amlodipine and valstartan-hctz.He was having occasional chest pressure at that time and had nitroglycerine refilled, which he took a few times, but has not had to take again for about a week.   He reports good compliance with treatment. He is not having side effects.  He is following a Regular diet.  He is not exercising. He does not smoke.  Use of agents associated with hypertension: NSAIDS.   Outside blood pressures are not checked. Symptoms: No chest pain No chest pressure  No palpitations No syncope  No dyspnea No orthopnea  No paroxysmal nocturnal dyspnea No lower extremity edema   Pertinent labs: Lab Results  Component Value Date   CHOL 175 03/31/2017   HDL 51 03/31/2017   LDLCALC 101 (H) 03/31/2017   TRIG 115 03/31/2017   CHOLHDL 3.4 03/31/2017   Lab Results  Component Value Date   NA 137 01/26/2019   K 4.2 01/26/2019   CREATININE 1.10 01/26/2019   GFRNONAA >60 01/26/2019   GFRAA >60 01/26/2019   GLUCOSE 119 (H) 01/26/2019     The 10-year ASCVD risk score Mikey Bussing DC Jr., et al., 2013) is: 24.3%    ---------------------------------------------------------------------------------------------  Chest tightness From 01/23/2020-No active pain during today's visit. Previous work up by Dr. Clayborn Bigness with no sign of atherosclerotic heart disease. Likely has demand ischemia related to accelerated hypertension. Advised to immediately start back on his antihypertensive medications. refill- nitroGLYCERIN (NITROSTAT) 0.4 MG SL tablet; Place 1 tablet (0.4 mg total) under the tongue every 5 (five) minutes as needed for chest pain (Up to 3 tablets).  Dispense: 50 tablet; Refill: 3 which he has been out for an extended period of times. Is to go to ER if any new sx develop or if he has chest pain that does not resolve with nitroglycerine.       Medications: Outpatient Medications Prior to Visit  Medication Sig  . amLODipine (NORVASC) 5 MG tablet Take 1 tablet (5 mg total) by mouth daily.  Marland Kitchen aspirin EC 81 MG tablet Take 81 mg by mouth daily.  Marland Kitchen atorvastatin (LIPITOR) 20 MG tablet Take 1 tablet (20 mg total) by mouth daily.  . fluticasone (FLONASE) 50 MCG/ACT nasal spray Place 2 sprays into both nostrils daily.  . metoprolol succinate (TOPROL-XL) 50 MG 24 hr tablet Take 0.5 tablets (25 mg total) by mouth daily. Take with or immediately following a meal.  . nitroGLYCERIN (NITROSTAT) 0.4 MG SL tablet Place 1 tablet (0.4 mg total) under the tongue every 5 (five) minutes as needed for chest  pain (Up to 3 tablets).  Marland Kitchen oxymetazoline (AFRIN) 0.05 % nasal spray Place 2 sprays into both nostrils 2 (two) times daily.   . valsartan-hydrochlorothiazide (DIOVAN-HCT) 80-12.5 MG tablet Take 1 tablet by mouth daily.   No facility-administered medications prior to visit.    Review of Systems  Constitutional: Negative for appetite change, chills and fever.  Respiratory: Negative for chest tightness, shortness of breath and wheezing.   Cardiovascular: Negative for chest pain and palpitations.       Tightness in  chest; unchanged from baseline  Gastrointestinal: Negative for abdominal pain, nausea and vomiting.      Objective    BP (!) 181/79 (BP Location: Right Arm, Patient Position: Sitting, Cuff Size: Large)   Pulse 83   Temp 98.4 F (36.9 C) (Oral)   Resp 16   Wt 210 lb (95.3 kg)   BMI 32.89 kg/m    Physical Exam   General: Appearance:    Mildly obese male in no acute distress  Eyes:    PERRL, conjunctiva/corneas clear, EOM's intact       Lungs:     Clear to auscultation bilaterally, respirations unlabored  Heart:    Normal heart rate. Normal rhythm. No murmurs, rubs, or gallops.          Assessment & Plan     1. Accelerated hypertension Much better since starting back on medications and taking consistently. Had previously required 10mg  amlodipine daily, so will double dose to - amLODipine (NORVASC) 10 MG tablet; Take 1 tablet (10 mg total) by mouth daily.  Dispense: 30 tablet; Refill: 5   - Comprehensive metabolic panel - Lipid panel - TSH  2. Obesity Given information regarding DASH diet.   Future Appointments  Date Time Provider [place]  02/21/2020  2:00 PM [facility]  03/19/2020 11:00 AM Dovey Fatzinger, Kirstie Peri, MD [facility]         The entirety of the information documented in the History of Present Illness, Review of Systems and Physical Exam were personally obtained by me. Portions of this information were initially documented by the CMA and reviewed by me for thoroughness and accuracy.      Lelon Huh, MD  [facility] [phone] (phone) [phone] (fax)  [place]

## 2020-02-19 ENCOUNTER — Other Ambulatory Visit: Payer: Self-pay | Admitting: Family Medicine

## 2020-02-19 DIAGNOSIS — I10 Essential (primary) hypertension: Secondary | ICD-10-CM

## 2020-02-20 ENCOUNTER — Other Ambulatory Visit: Payer: Self-pay | Admitting: Family Medicine

## 2020-02-20 ENCOUNTER — Other Ambulatory Visit: Payer: Self-pay

## 2020-02-20 DIAGNOSIS — I10 Essential (primary) hypertension: Secondary | ICD-10-CM

## 2020-02-20 MED ORDER — METOPROLOL SUCCINATE ER 50 MG PO TB24: 50.0000 mg | ORAL_TABLET | Freq: Every day | ORAL | 3 refills | Status: DC

## 2020-02-20 NOTE — Telephone Encounter (Signed)
Pharmacy needs a new script sent over with direction sowing 1 tab daily instead of half a tab daily.

## 2020-02-20 NOTE — Telephone Encounter (Signed)
Medication:   valsartan-hydrochlorothiazide (DIOVAN-HCT) 80-12.5 MG tablet [[id number]]  amLODipine (NORVASC) 10 MG tablet [[id number]]  metoprolol succinate (TOPROL-XL) 50 MG 24 hr tablet [[id number]]    Has the patient contacted their pharmacy? yes    Preferred Pharmacy (with phone number or street name):  [id number] [address], [place] - [address]  [place] [address]  Phone: [phone] Fax: [phone]     Agent: Please be advised that RX refills may take up to 3 business days. We ask that you follow-up with your pharmacy.

## 2020-02-21 ENCOUNTER — Ambulatory Visit: Payer: Medicaid Other

## 2020-03-15 ENCOUNTER — Other Ambulatory Visit: Payer: Self-pay | Admitting: Family Medicine

## 2020-03-15 DIAGNOSIS — I10 Essential (primary) hypertension: Secondary | ICD-10-CM

## 2020-03-18 NOTE — Progress Notes (Signed)
Established patient visit   Patient: Terrance Carter   DOB: April 16, 1960   60 y.o. Male  MRN: [id number] Visit Date: 03/19/2020  Today's healthcare provider: Lelon Huh, MD   Chief Complaint  Patient presents with  . Hypertension   Subjective    HPI  Hypertension, follow-up  BP Readings from Last 3 Encounters:  03/19/20 136/79  02/06/20 (!) 170/78  01/23/20 (!) 249/132   Wt Readings from Last 3 Encounters:  03/19/20 206 lb (93.4 kg)  02/06/20 210 lb (95.3 kg)  01/23/20 209 lb 3.2 oz (94.9 kg)     He was last seen for hypertension 6 weeks ago.  BP at that visit was 170/78. Management since that visit includes increasing amlodipine to 10mg  daily.  He reports good compliance with treatment. He is not having side effects.  He is following a Regular diet. He is not exercising. He does not smoke.  Use of agents associated with hypertension: NSAIDS.   Outside blood pressures are not checked. Symptoms: No chest pain No chest pressure  No palpitations No syncope  No dyspnea No orthopnea  No paroxysmal nocturnal dyspnea No lower extremity edema   Pertinent labs: Lab Results  Component Value Date   CHOL 175 03/31/2017   HDL 51 03/31/2017   LDLCALC 101 (H) 03/31/2017   TRIG 115 03/31/2017   CHOLHDL 3.4 03/31/2017   Lab Results  Component Value Date   NA 137 01/26/2019   K 4.2 01/26/2019   CREATININE 1.10 01/26/2019   GFRNONAA >60 01/26/2019   GFRAA >60 01/26/2019   GLUCOSE 119 (H) 01/26/2019     The 10-year ASCVD risk score Mikey Bussing DC Jr., et al., 2013) is: 14.9%       Medications: Outpatient Medications Prior to Visit  Medication Sig  . amLODipine (NORVASC) 10 MG tablet Take 1 tablet (10 mg total) by mouth daily.  Marland Kitchen aspirin EC 81 MG tablet Take 81 mg by mouth daily.  Marland Kitchen atorvastatin (LIPITOR) 20 MG tablet Take 1 tablet (20 mg total) by mouth daily.  . fluticasone (FLONASE) 50 MCG/ACT nasal spray Place 2 sprays into both nostrils daily.  . metoprolol  succinate (TOPROL-XL) 50 MG 24 hr tablet Take 1 tablet (50 mg total) by mouth daily. Take with or immediately following a meal.  . nitroGLYCERIN (NITROSTAT) 0.4 MG SL tablet Place 1 tablet (0.4 mg total) under the tongue every 5 (five) minutes as needed for chest pain (Up to 3 tablets).  Marland Kitchen oxymetazoline (AFRIN) 0.05 % nasal spray Place 2 sprays into both nostrils 2 (two) times daily.   . valsartan-hydrochlorothiazide (DIOVAN-HCT) 80-12.5 MG tablet TAKE 1 TABLET BY MOUTH DAILY   No facility-administered medications prior to visit.    Review of Systems  Constitutional: Negative.   Respiratory: Negative.   Cardiovascular: Negative.   Musculoskeletal: Negative.   Neurological: Negative.       Objective    BP 136/79 (BP Location: Left Arm, Patient Position: Sitting, Cuff Size: Large)   Pulse 86   Temp 99.3 F (37.4 C) (Temporal)   Resp 18   Wt 206 lb (93.4 kg)   SpO2 97% Comment: room air  BMI 32.26 kg/m    Physical Exam   General: Appearance:    Obese male in no acute distress  Eyes:    PERRL, conjunctiva/corneas clear, EOM's intact       Lungs:     Clear to auscultation bilaterally, respirations unlabored  Heart:    Normal heart rate.  Normal rhythm. No murmurs, rubs, or gallops.   MS:   All extremities are intact.   Neurologic:   Awake, alert, oriented x 3. No apparent focal neurological           defect.         Assessment & Plan     1. 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 Accelerated hypertension Well controlled.  Continue current medications.    - Lipid panel - Comprehensive metabolic panel - TSH - valsartan-hydrochlorothiazide (DIOVAN-HCT) 80-12.5 MG tablet; Take 1 tablet by mouth daily.  Dispense: 90 tablet; Refill: 2 - metoprolol succinate (TOPROL-XL) 50 MG 24 hr tablet; Take 1 tablet (50 mg total) by mouth daily. Take with or immediately following a meal.  Dispense: 90  tablet; Refill: 3    See how labs look, follow up to be set up after reviewing labs.      The entirety of the information documented in the History of Present Illness, Review of Systems and Physical Exam were personally obtained by me. Portions of this information were initially documented by the CMA and reviewed by me for thoroughness and accuracy.      Mila Merry, MD  [facility] [phone] (phone) [phone] (fax)  [facility]

## 2020-03-19 ENCOUNTER — Ambulatory Visit: Payer: Medicaid Other | Admitting: Family Medicine

## 2020-03-19 ENCOUNTER — Other Ambulatory Visit: Payer: Self-pay

## 2020-03-19 ENCOUNTER — Encounter: Payer: Self-pay | Admitting: Family Medicine

## 2020-03-19 ENCOUNTER — Other Ambulatory Visit: Payer: Self-pay | Admitting: Family Medicine

## 2020-03-19 VITALS — BP 136/79 | HR 86 | Temp 99.3°F | Resp 18 | Wt 206.0 lb

## 2020-03-19 DIAGNOSIS — I43 Cardiomyopathy in diseases classified elsewhere: Secondary | ICD-10-CM

## 2020-03-19 DIAGNOSIS — I10 Essential (primary) hypertension: Secondary | ICD-10-CM

## 2020-03-19 DIAGNOSIS — I119 Hypertensive heart disease without heart failure: Secondary | ICD-10-CM

## 2020-03-19 MED ORDER — VALSARTAN-HYDROCHLOROTHIAZIDE 80-12.5 MG PO TABS: 1.0000 | ORAL_TABLET | Freq: Every day | ORAL | 2 refills | Status: DC

## 2020-03-19 MED ORDER — METOPROLOL SUCCINATE ER 50 MG PO TB24: 50.0000 mg | ORAL_TABLET | Freq: Every day | ORAL | 3 refills | Status: DC

## 2020-03-19 NOTE — Patient Instructions (Addendum)
.   Please review the attached list of medications and notify my office if there are any errors.   . Covid-19 vaccines: The Covid vaccines have been given to hundreds of millions of people and found to be very effective and are as safe as any other vaccine.  The [employer] vaccine has been associated with very rare dangerous blood clots, but only in adult women under the age of 60.  The risk of dying from Covid infections is much higher than having a serious reaction to the vaccine.  I strongly recommend getting fully vaccinated against Covid-19.  I recommend that adult women under 60 get fully vaccinated, but the Moderna and Pfizer vaccines may be safer for those women than the Johnson [employer] vaccine.   

## 2020-03-19 NOTE — Telephone Encounter (Signed)
Patient did not have labs done that were ordered in November.  Last labs were in November 2020

## 2020-03-20 LAB — COMPREHENSIVE METABOLIC PANEL
ALT: 26 IU/L (ref 0–44)
AST: 26 IU/L (ref 0–40)
Albumin/Globulin Ratio: 1.6 (ref 1.2–2.2)
Albumin: 4.9 g/dL — ABNORMAL HIGH (ref 3.8–4.8)
Alkaline Phosphatase: 84 IU/L (ref 44–121)
BUN/Creatinine Ratio: 14 (ref 10–24)
BUN: 19 mg/dL (ref 8–27)
Bilirubin Total: 0.5 mg/dL (ref 0.0–1.2)
CO2: 24 mmol/L (ref 20–29)
Calcium: 9.7 mg/dL (ref 8.6–10.2)
Chloride: 100 mmol/L (ref 96–106)
Creatinine, Ser: 1.39 mg/dL — ABNORMAL HIGH (ref 0.76–1.27)
GFR calc Af Amer: 63 mL/min/{1.73_m2} (ref >59)
GFR calc non Af Amer: 54 mL/min/{1.73_m2} — ABNORMAL LOW (ref >59)
Globulin, Total: 3 g/dL (ref 1.5–4.5)
Glucose: 111 mg/dL — ABNORMAL HIGH (ref 65–99)
Potassium: 3.8 mmol/L (ref 3.5–5.2)
Sodium: 139 mmol/L (ref 134–144)
Total Protein: 7.9 g/dL (ref 6.0–8.5)

## 2020-03-20 LAB — LIPID PANEL
Chol/HDL Ratio: 3.5 ratio (ref 0.0–5.0)
Cholesterol, Total: 188 mg/dL (ref 100–199)
HDL: 54 mg/dL (ref >39)
LDL Chol Calc (NIH): 113 mg/dL — ABNORMAL HIGH (ref 0–99)
Triglycerides: 118 mg/dL (ref 0–149)
VLDL Cholesterol Cal: 21 mg/dL (ref 5–40)

## 2020-03-20 LAB — TSH: TSH: 3.3 u[IU]/mL (ref 0.450–4.500)

## 2020-07-25 ENCOUNTER — Emergency Department
Admission: EM | Admit: 2020-07-25 | Discharge: 2020-07-25 | Disposition: A | Discharge status: Home / Self Care | Payer: Medicaid Other | Attending: Emergency Medicine | Admitting: Emergency Medicine

## 2020-07-25 ENCOUNTER — Emergency Department: Payer: Medicaid Other

## 2020-07-25 ENCOUNTER — Encounter: Payer: Self-pay | Admitting: *Deleted

## 2020-07-25 ENCOUNTER — Other Ambulatory Visit: Payer: Self-pay

## 2020-07-25 DIAGNOSIS — R778 Other specified abnormalities of plasma proteins: Secondary | ICD-10-CM | POA: Insufficient documentation

## 2020-07-25 DIAGNOSIS — Z8521 Personal history of malignant neoplasm of larynx: Secondary | ICD-10-CM | POA: Diagnosis not present

## 2020-07-25 DIAGNOSIS — Z7982 Long term (current) use of aspirin: Secondary | ICD-10-CM | POA: Diagnosis not present

## 2020-07-25 DIAGNOSIS — K429 Umbilical hernia without obstruction or gangrene: Secondary | ICD-10-CM | POA: Diagnosis not present

## 2020-07-25 DIAGNOSIS — Z87891 Personal history of nicotine dependence: Secondary | ICD-10-CM | POA: Insufficient documentation

## 2020-07-25 DIAGNOSIS — R0602 Shortness of breath: Secondary | ICD-10-CM | POA: Diagnosis not present

## 2020-07-25 DIAGNOSIS — U071 COVID-19: Secondary | ICD-10-CM | POA: Diagnosis not present

## 2020-07-25 DIAGNOSIS — Z79899 Other long term (current) drug therapy: Secondary | ICD-10-CM | POA: Diagnosis not present

## 2020-07-25 DIAGNOSIS — R1084 Generalized abdominal pain: Secondary | ICD-10-CM | POA: Diagnosis not present

## 2020-07-25 DIAGNOSIS — I10 Essential (primary) hypertension: Secondary | ICD-10-CM | POA: Diagnosis not present

## 2020-07-25 DIAGNOSIS — K4020 Bilateral inguinal hernia, without obstruction or gangrene, not specified as recurrent: Secondary | ICD-10-CM | POA: Diagnosis not present

## 2020-07-25 DIAGNOSIS — R911 Solitary pulmonary nodule: Secondary | ICD-10-CM | POA: Diagnosis not present

## 2020-07-25 DIAGNOSIS — R197 Diarrhea, unspecified: Secondary | ICD-10-CM | POA: Diagnosis not present

## 2020-07-25 DIAGNOSIS — R6883 Chills (without fever): Secondary | ICD-10-CM

## 2020-07-25 DIAGNOSIS — R079 Chest pain, unspecified: Secondary | ICD-10-CM

## 2020-07-25 DIAGNOSIS — R0789 Other chest pain: Secondary | ICD-10-CM | POA: Diagnosis not present

## 2020-07-25 DIAGNOSIS — R059 Cough, unspecified: Secondary | ICD-10-CM

## 2020-07-25 LAB — CBC WITH DIFFERENTIAL/PLATELET
Abs Immature Granulocytes: 0.03 10*3/uL (ref 0.00–0.07)
Basophils Absolute: 0 10*3/uL (ref 0.0–0.1)
Basophils Relative: 1 %
Eosinophils Absolute: 0 10*3/uL (ref 0.0–0.5)
Eosinophils Relative: 0 %
HCT: 38.6 % — ABNORMAL LOW (ref 39.0–52.0)
Hemoglobin: 12.8 g/dL — ABNORMAL LOW (ref 13.0–17.0)
Immature Granulocytes: 1 %
Lymphocytes Relative: 13 %
Lymphs Abs: 0.4 10*3/uL — ABNORMAL LOW (ref 0.7–4.0)
MCH: 32.2 pg (ref 26.0–34.0)
MCHC: 33.2 g/dL (ref 30.0–36.0)
MCV: 97.2 fL (ref 80.0–100.0)
Monocytes Absolute: 1 10*3/uL (ref 0.1–1.0)
Monocytes Relative: 30 %
Neutro Abs: 1.8 10*3/uL (ref 1.7–7.7)
Neutrophils Relative %: 55 %
Platelets: 185 10*3/uL (ref 150–400)
RBC: 3.97 MIL/uL — ABNORMAL LOW (ref 4.22–5.81)
RDW: 13.3 % (ref 11.5–15.5)
WBC: 3.3 10*3/uL — ABNORMAL LOW (ref 4.0–10.5)
nRBC: 0 % (ref 0.0–0.2)

## 2020-07-25 LAB — COMPREHENSIVE METABOLIC PANEL
ALT: 21 U/L (ref 0–44)
AST: 28 U/L (ref 15–41)
Albumin: 4 g/dL (ref 3.5–5.0)
Alkaline Phosphatase: 56 U/L (ref 38–126)
Anion gap: 10 (ref 5–15)
BUN: 24 mg/dL — ABNORMAL HIGH (ref 8–23)
CO2: 27 mmol/L (ref 22–32)
Calcium: 8.6 mg/dL — ABNORMAL LOW (ref 8.9–10.3)
Chloride: 96 mmol/L — ABNORMAL LOW (ref 98–111)
Creatinine, Ser: 1.4 mg/dL — ABNORMAL HIGH (ref 0.61–1.24)
GFR, Estimated: 57 mL/min — ABNORMAL LOW (ref >60)
Glucose, Bld: 114 mg/dL — ABNORMAL HIGH (ref 70–99)
Potassium: 4 mmol/L (ref 3.5–5.1)
Sodium: 133 mmol/L — ABNORMAL LOW (ref 135–145)
Total Bilirubin: 0.7 mg/dL (ref 0.3–1.2)
Total Protein: 7.4 g/dL (ref 6.5–8.1)

## 2020-07-25 LAB — URINALYSIS, COMPLETE (UACMP) WITH MICROSCOPIC
Bacteria, UA: NONE SEEN
Bilirubin Urine: NEGATIVE
Glucose, UA: NEGATIVE mg/dL
Hgb urine dipstick: NEGATIVE
Ketones, ur: NEGATIVE mg/dL
Leukocytes,Ua: NEGATIVE
Nitrite: NEGATIVE
Protein, ur: NEGATIVE mg/dL
Specific Gravity, Urine: 1.024 (ref 1.005–1.030)
pH: 5 (ref 5.0–8.0)

## 2020-07-25 LAB — TROPONIN I (HIGH SENSITIVITY)
Troponin I (High Sensitivity): 41 ng/L — ABNORMAL HIGH (ref <18)
Troponin I (High Sensitivity): 49 ng/L — ABNORMAL HIGH (ref <18)

## 2020-07-25 LAB — RESP PANEL BY RT-PCR (FLU A&B, COVID) ARPGX2
Influenza A by PCR: NEGATIVE
Influenza B by PCR: NEGATIVE
SARS Coronavirus 2 by RT PCR: POSITIVE — AB

## 2020-07-25 LAB — LIPASE, BLOOD: Lipase: 45 U/L (ref 11–51)

## 2020-07-25 MED ORDER — ONDANSETRON HCL 4 MG/2ML IJ SOLN
4.0000 mg | Freq: Once | INTRAMUSCULAR | Status: AC
  Administered 2020-07-25: 4 mg via INTRAVENOUS
  Filled 2020-07-25: qty 2

## 2020-07-25 MED ORDER — ONDANSETRON HCL 4 MG PO TABS: 4.0000 mg | ORAL_TABLET | Freq: Three times a day (TID) | ORAL | 0 refills | Status: DC | PRN

## 2020-07-25 NOTE — ED Notes (Signed)
Urine specimen requested, urinal provided.

## 2020-07-25 NOTE — ED Notes (Signed)
Dr. Tamala Julian notified patient tested positive for COVID.

## 2020-07-25 NOTE — ED Provider Notes (Signed)
[facility] Emergency Department Provider Note  ____________________________________________   Event Date/Time   First MD Initiated Contact with Patient 07/25/20 2011     (approximate)  I have reviewed the triage vital signs and the nursing notes.   HISTORY  Chief Complaint Abdominal Pain and Diarrhea   HPI Terrance Carter is a 60 y.o. male with a past medical history of HTN, CAD and throat cancer who presents for assessment approximately 3 days of some generalized abdominal pain associate with diarrhea.  He also states that over the last couple of days he has had some nonproductive cough and some intermittent shortness of breath and chest tightness.  Endorses some nausea but has not had any vomiting.  Denies any headache, earache, sore throat, back pain, rash or burning with urination.  Denies any blood in his urine or stool.  No extremity pain.  No recent injuries or falls.  Denies any recent EtOH or illicit drug use.  Denies any tobacco abuse.  States he takes his blood pressure medicines.  No prior similar episodes.  No other acute concerns at this time.          Past Medical History:  Diagnosis Date  . Cancer (HCC) Laryngeal  . History of chicken pox   . History of measles   . History of mumps   . Hypertension   . MI, old   . 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 Accelerated hypertension 01/23/2020  . Hypertensive urgency 01/25/2019  . Hypertensive cardiomyopathy ([place]) 03/26/2017  . 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 Dysphagia 11/23/2014  . Heart disease 11/23/2014  . Hidradenitis suppurativa 11/23/2014  . Keloid 11/23/2014  . 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 Dr. Tami Ribas    Prior to Admission medications   Medication Sig Start Date End Date Taking? Authorizing  Provider  amLODipine (NORVASC) 10 MG tablet Take 1 tablet (10 mg total) by mouth daily. 02/06/20  Yes Birdie Sons, MD  aspirin EC 81 MG tablet Take 81 mg by mouth daily.   Yes [provider]  metoprolol succinate (TOPROL-XL) 50 MG 24 hr tablet Take 1 tablet (50 mg total) by mouth daily. Take with or immediately following a meal. 03/19/20  Yes Fisher, Kirstie Peri, MD  ondansetron (ZOFRAN) 4 MG tablet Take 1 tablet (4 mg total) by mouth every 8 (eight) hours as needed for up to 10 doses for nausea or vomiting. 07/25/20  Yes Lucrezia Starch, MD  valsartan-hydrochlorothiazide (DIOVAN-HCT) 80-12.5 MG tablet Take 1 tablet by mouth daily. 03/19/20  Yes Birdie Sons, MD  atorvastatin (LIPITOR) 20 MG tablet Take 1 tablet (20 mg total) by mouth daily. 01/26/19   Caren Griffins, MD  fluticasone (FLONASE) 50 MCG/ACT nasal spray Place 2 sprays into both nostrils daily. 09/24/14   Birdie Sons, MD  nitroGLYCERIN (NITROSTAT) 0.4 MG SL tablet Place 1 tablet (0.4 mg total) under the tongue every 5 (five) minutes as needed for chest pain (Up to 3 tablets). 01/23/20   Birdie Sons, MD  oxymetazoline (AFRIN) 0.05 % nasal spray Place 2 sprays into both nostrils 2 (two) times daily.     [provider]    Allergies Patient has no known allergies.  Family History  Problem Relation Age of Onset  .  Diabetes Mother        type 2  . Hypertension Mother   . Diabetes Maternal Uncle     Social History Social History   Tobacco Use  . Smoking status: Former Smoker    Packs/day: 1.00    Years: 20.00    Pack years: 20.00  . Smokeless tobacco: Never Used  Substance Use Topics  . Alcohol use: Yes    Alcohol/week: 0.0 standard drinks    Comment: Drinks wine once a month  . Drug use: No    Review of Systems  Review of Systems  Constitutional: Positive for chills and malaise/fatigue. Negative for fever.  HENT: Negative for sore throat.   Eyes: Negative for pain.  Respiratory:  Positive for cough and shortness of breath. Negative for stridor.   Cardiovascular: Positive for chest pain.  Gastrointestinal: Positive for abdominal pain and diarrhea. Negative for vomiting.  Genitourinary: Negative for dysuria.  Musculoskeletal: Negative for myalgias.  Skin: Negative for rash.  Neurological: Negative for seizures, loss of consciousness and headaches.  Psychiatric/Behavioral: Negative for suicidal ideas.  All other systems reviewed and are negative.     ____________________________________________   PHYSICAL EXAM:  VITAL SIGNS: ED Triage Vitals  Enc Vitals Group     BP 07/25/20 2005 (!) 188/108     Pulse Rate 07/25/20 2005 89     Resp 07/25/20 2005 16     Temp 07/25/20 2005 99.1 F (37.3 C)     Temp Source 07/25/20 2005 Oral     SpO2 07/25/20 2005 97 %     Weight 07/25/20 2006 205 lb 14.6 oz (93.4 kg)     Height 07/25/20 2006 5\' 6"  (1.676 m)     Head Circumference --      Peak Flow --      Pain Score 07/25/20 2005 8     Pain Loc --      Pain Edu? --      Excl. in [place]? --    Vitals:   07/25/20 2200 07/25/20 2230  BP: (!) 162/96 (!) 155/78  Pulse: 75 75  Resp: 14 14  Temp:    SpO2: 98% 99%   Physical Exam Vitals and nursing note reviewed. Exam conducted with a chaperone present.  Constitutional:      Appearance: He is well-developed.  HENT:     Head: Normocephalic and atraumatic.     Right Ear: External ear normal.     Left Ear: External ear normal.     Nose: Nose normal.  Eyes:     Conjunctiva/sclera: Conjunctivae normal.  Cardiovascular:     Rate and Rhythm: Normal rate and regular rhythm.     Heart sounds: No murmur heard.   Pulmonary:     Effort: Pulmonary effort is normal. No respiratory distress.     Breath sounds: Normal breath sounds.  Abdominal:     General: There is distension.     Palpations: Abdomen is soft.     Tenderness: There is no abdominal tenderness. There is no right CVA tenderness or left CVA tenderness.      Hernia: There is no hernia in the left inguinal area or right inguinal area.  Musculoskeletal:     Cervical back: Neck supple.  Skin:    General: Skin is warm and dry.     Capillary Refill: Capillary refill takes less than 2 seconds.  Neurological:     Mental Status: He is alert and oriented to person, place, and time.  Psychiatric:  Mood and Affect: Mood normal.      ____________________________________________   LABS (all labs ordered are listed, but only abnormal results are displayed)  Labs Reviewed  RESP PANEL BY RT-PCR (FLU A&B, COVID) ARPGX2 - Abnormal; Notable for the following components:      Result Value   SARS Coronavirus 2 by RT PCR POSITIVE (*)    All other components within normal limits  CBC WITH DIFFERENTIAL/PLATELET - Abnormal; Notable for the following components:   WBC 3.3 (*)    RBC 3.97 (*)    Hemoglobin 12.8 (*)    HCT 38.6 (*)    Lymphs Abs 0.4 (*)    All other components within normal limits  COMPREHENSIVE METABOLIC PANEL - Abnormal; Notable for the following components:   Sodium 133 (*)    Chloride 96 (*)    Glucose, Bld 114 (*)    BUN 24 (*)    Creatinine, Ser 1.40 (*)    Calcium 8.6 (*)    GFR, Estimated 57 (*)    All other components within normal limits  URINALYSIS, COMPLETE (UACMP) WITH MICROSCOPIC - Abnormal; Notable for the following components:   Color, Urine YELLOW (*)    APPearance CLEAR (*)    All other components within normal limits  TROPONIN I (HIGH SENSITIVITY) - Abnormal; Notable for the following components:   Troponin I (High Sensitivity) 41 (*)    All other components within normal limits  TROPONIN I (HIGH SENSITIVITY) - Abnormal; Notable for the following components:   Troponin I (High Sensitivity) 49 (*)    All other components within normal limits  LIPASE, BLOOD   ____________________________________________  EKG  Sinus rhythm with ventricular of 77, artifact versus nonspecific ST change in lead III without  other evidence of acute ischemia or other significant underlying arrhythmia. ____________________________________________  RADIOLOGY  ED MD interpretation: Chest x-ray has no focal consolidation, effusion, edema, pneumothorax or any other clear acute intrathoracic process.  CT abdomen pelvis remarkable for right lower lobe lung nodule without any other clear acute abdominopelvic pathology including evidence of cholecystitis, pancreatitis, diverticulitis, appendicitis, kidney stone, pyelonephritis or any other clear acute pathology.  Official radiology report(s): CT ABDOMEN PELVIS WO CONTRAST  Result Date: 07/25/2020 CLINICAL DATA:  Diffuse abdominal pain and diarrhea for the past 3 days. History of supraglottic cancer. EXAM: CT ABDOMEN AND PELVIS WITHOUT CONTRAST TECHNIQUE: Multidetector CT imaging of the abdomen and pelvis was performed following the standard protocol without IV contrast. COMPARISON:  PET-CT dated 12/25/2010. FINDINGS: Lower chest: 8 x 3 mm right lower lobe and nodule adjacent to the diaphragm on image number 20/3, not previously present. Borderline enlarged heart. Hepatobiliary: No focal liver abnormality is seen. No gallstones, gallbladder wall thickening, or biliary dilatation. Pancreas: Unremarkable. No pancreatic ductal dilatation or surrounding inflammatory changes. Spleen: Normal in size without focal abnormality. Adrenals/Urinary Tract: Adrenal glands are unremarkable. Kidneys are normal, without renal calculi, focal lesion, or hydronephrosis. Bladder is unremarkable. Stomach/Bowel: Stomach is within normal limits. Appendix appears normal. No evidence of bowel wall thickening, distention, or inflammatory changes. Vascular/Lymphatic: No significant vascular findings are present. No enlarged abdominal or pelvic lymph nodes. Reproductive: Moderately enlarged prostate gland containing coarse calcifications. Other: Small umbilical and bilateral inguinal hernias containing fat.  Musculoskeletal: Lumbar and lower thoracic spine degenerative changes. IMPRESSION: 1. No acute abnormality. 2. 5.5 mm mean diameter right lower lobe lung nodule. Consideration of a chest CT to evaluate for the presence or absence of additional nodules is recommended. If not, non-contrast chest  CT at 6-12 months is recommended. If the nodule is stable at time of repeat CT, then future CT at 18-24 months (from today's scan) would be recommended. This recommendation follows the consensus statement: Guidelines for Management of Incidental Pulmonary Nodules Detected on CT Images: From the Fleischner Society 2017; Radiology 2017; 284:228-243. Electronically Signed   By: Claudie Revering M.D.   On: 07/25/2020 20:52   DG Chest 2 View  Result Date: 07/25/2020 CLINICAL DATA:  Shortness of breath for several days EXAM: CHEST - 2 VIEW COMPARISON:  01/25/2019 FINDINGS: The heart size and mediastinal contours are within normal limits. Both lungs are clear. The visualized skeletal structures are unremarkable. IMPRESSION: No active cardiopulmonary disease. Electronically Signed   By: Inez Catalina M.D.   On: 07/25/2020 20:49    ____________________________________________   PROCEDURES  Procedure(s) performed (including Critical Care):  .1-3 Lead EKG Interpretation Performed by: Lucrezia Starch, MD Authorized by: Lucrezia Starch, MD     Interpretation: normal     ECG rate assessment: normal     Rhythm: sinus rhythm     Ectopy: none     Conduction: normal       ____________________________________________   INITIAL IMPRESSION / ASSESSMENT AND PLAN / ED COURSE      Patient presents with above-stated history exam for assessment approximately 3 days of some generalized crampy abdominal pain associate with diarrhea, cough, chest tightness and intermittent shortness of breath and some chills.  On arrival he is hypertensive with a BP of 188/108 with otherwise stable vital signs on room air.  His lungs are  clear bilaterally and his abdomen is little distended but soft and nontender throughout.  No CVA tenderness.  Differential includes cholecystitis, pancreatitis, diverticulitis, pneumonia, ACS, arrhythmia, anemia, metabolic derangements and possible acute viral syndrome with component of enteritis.  Very low suspicion for PE patient has no hypoxia, tachypnea, tachycardia and describes his chest tightness and shortness of breath is intermittent.  In addition there are no recent clear risk factors.  Chest x-ray has no focal consolidation, effusion, edema, pneumothorax or any other clear acute intrathoracic process.  CT abdomen pelvis remarkable for right lower lobe lung nodule without any other clear acute abdominopelvic pathology including evidence of cholecystitis, pancreatitis, diverticulitis, appendicitis, kidney stone, pyelonephritis or any other clear acute pathology.  CBC remarkable for WBC count of 3.3, hemoglobin of 12.8 and normal platelets.  CMP has no significant electrolyte or metabolic derangements.  Kidney function is at baseline.  UA has no evidence of infection or blood . CBC has WBC count of 3.3 with hemoglobin close to baseline and normal platelets.  Lipase is not consistent with acute pancreatitis.  Opponent is slightly elevated at 41 although this is down from 1 was last checked a year ago at 63 and 83.  Given reassuring ECG with stable troponins at 49 and 41 over 2 hours I have a low suspicion for ACS.  Suspect mild elevation and secondary to some mild demand versus chronic elevation from patient's CKD.  Patient is also denying any chest pain on reassessment.  COVID is positive.  I suspect this is etiology of patient's symptoms.  Given otherwise reassuring exam and work-up stable vitals and no evidence of hypoxia low suspicion for other Mi life-threatening process and patient tolerating p.o. I think is safe for discharge with outpatient follow-up.  Rx written for Zofran.  Discharged stable  condition.  Strict return precautions advised and discussed.  ____________________________________________   FINAL CLINICAL IMPRESSION(S) /  ED DIAGNOSES  Final diagnoses:  Generalized abdominal pain  Diarrhea, unspecified type  Cough  Chest pain, unspecified type  Chills  Hypertension, unspecified type  Lung nodule  COVID  Troponin I above reference range    Medications  ondansetron (ZOFRAN) injection 4 mg (4 mg Intravenous Given 07/25/20 2057)     ED Discharge Orders         Ordered    Ambulatory referral for Covid Treatment        07/25/20 2222    ondansetron (ZOFRAN) 4 MG tablet  Every 8 hours PRN        07/25/20 2223           Note:  This document was prepared using Dragon voice recognition software and may include unintentional dictation errors.   Lucrezia Starch, MD 07/25/20 2300

## 2020-07-25 NOTE — ED Notes (Signed)
Patient taken to Xray  

## 2020-07-25 NOTE — ED Triage Notes (Signed)
Generalized abd pain and diarrhea x 3 days. No NV. Pt reports he ate at a friends house that he may have had some bad food but denies others having similar symptoms. Pt reports intermittent hot spells but has not checked a temp.

## 2020-07-25 NOTE — ED Notes (Signed)
Patient taken to CT.

## 2020-07-26 ENCOUNTER — Telehealth: Payer: Self-pay

## 2020-07-26 NOTE — Telephone Encounter (Signed)
Called to discuss with patient about COVID-19 symptoms and the use of one of the available treatments for those with mild to moderate Covid symptoms and at a high risk of hospitalization.  Pt appears to qualify for outpatient treatment due to co-morbid conditions and/or a member of an at-risk group in accordance with the FDA Emergency Use Authorization.    Symptom onset: 07/22/20 Cough,chills,SOB,fatigue Vaccinated: Unknown Booster? Unknown Immunocompromised? Yes Qualifiers: Throat cancer,HTNCAD NIH Criteria: Tier 1  Unable to reach pt - Voice mailbox not set up, unable to leave message.   Terrance Carter

## 2020-07-26 NOTE — Telephone Encounter (Signed)
Transition Care Management Follow-up Telephone Call  Date of discharge and from where: 07/25/2020 from [facility]  How have you been since you were released from the hospital? Pt stated that he is feeling much better this morning.   Any questions or concerns? No  Items Reviewed:  Did the pt receive and understand the discharge instructions provided? Yes   Medications obtained and verified? Yes   Other? No   Any new allergies since your discharge? No   Dietary orders reviewed? n/a  Do you have support at home? Yes   Functional Questionnaire: (I = Independent and D = Dependent) ADLs: I  Bathing/Dressing- I  Meal Prep- I  Eating- I  Maintaining continence- I  Transferring/Ambulation- I  Managing Meds- I  Follow up appointments reviewed:   PCP Hospital f/u appt confirmed? No    Specialist Hospital f/u appt confirmed? No    Are transportation arrangements needed? No   If their condition worsens, is the pt aware to call PCP or go to the Emergency Dept.? Yes  Was the patient provided with contact information for the PCP's office or ED? Yes  Was to pt encouraged to call back with questions or concerns? Yes

## 2020-08-22 ENCOUNTER — Other Ambulatory Visit: Payer: Self-pay | Admitting: Family Medicine

## 2020-08-22 DIAGNOSIS — I10 Essential (primary) hypertension: Secondary | ICD-10-CM

## 2020-08-22 NOTE — Telephone Encounter (Signed)
Requested Prescriptions  Pending Prescriptions Disp Refills  . amLODipine (NORVASC) 10 MG tablet [Pharmacy Med Name: AMLODIPINE BESYLATE 10MG  TABLETS] 30 tablet 2    Sig: TAKE 1 TABLET(10 MG) BY MOUTH DAILY     Cardiovascular:  Calcium Channel Blockers Failed - 08/22/2020  3:35 AM      Failed - Last BP in normal range    BP Readings from Last 1 Encounters:  07/25/20 (!) 145/70         Passed - Valid encounter within last 6 months    Recent Outpatient Visits          5 months ago Cardiomyopathy due to hypertension, without heart failure [facility])   [facility] Birdie Sons, MD   6 months ago Class 1 obesity without serious comorbidity with body mass index (BMI) of 32.0 to 32.9 in adult, unspecified obesity type   [facility] Birdie Sons, MD   7 months ago Accelerated hypertension   [facility] Birdie Sons, MD   1 year ago Cardiomyopathy due to hypertension, without heart failure [facility])   [place] Flinchum, Kelby Aline, FNP   1 year ago Epistaxis   [place], Kirstie Peri, MD      Future Appointments            In 1 month Fisher, Kirstie Peri, MD [facility] [place]

## 2020-09-24 ENCOUNTER — Ambulatory Visit: Payer: Self-pay | Admitting: Family Medicine

## 2020-10-18 ENCOUNTER — Ambulatory Visit: Payer: Self-pay | Admitting: Family Medicine

## 2020-11-11 ENCOUNTER — Ambulatory Visit: Payer: Self-pay | Admitting: Family Medicine

## 2020-12-10 ENCOUNTER — Other Ambulatory Visit: Payer: Self-pay | Admitting: Family Medicine

## 2020-12-10 DIAGNOSIS — I10 Essential (primary) hypertension: Secondary | ICD-10-CM

## 2021-03-20 ENCOUNTER — Telehealth: Payer: Self-pay

## 2021-03-20 NOTE — Telephone Encounter (Signed)
Copied from [place] [id number]. Topic: General - Inquiry >> Mar 20, 2021  9:55 AM Oneta Rack wrote: Reason for CRM: Patient would like to know the date and time he was dx with Cancer, patient states he does not have a vm set up and if unable to reach can you please mail a letter or note in chart if patient returns call. >> Mar 20, 2021 10:03 AM Scherrie Gerlach wrote: Pt says ok to call her

## 2021-03-21 NOTE — Telephone Encounter (Signed)
I called patient and gave information documented in his chart. Per chart notes:  History of glottic cancer: T3/T4 SCC supraglottis. Tracheostomy 12/23/2010.  s/p 2 cycles induction chemotherapy/radiation completed February 2013

## 2021-05-19 ENCOUNTER — Other Ambulatory Visit: Payer: Self-pay | Admitting: Family Medicine

## 2021-05-19 DIAGNOSIS — I10 Essential (primary) hypertension: Secondary | ICD-10-CM

## 2021-05-20 NOTE — Telephone Encounter (Signed)
Requested medication (s) are due for refill today: yes ? ?Requested medication (s) are on the active medication list: yes ? ?Last refill:  03/19/2020 #90 with 3 RF ? ?Future visit scheduled: no ? ?Notes to clinic:  Failed protocol due to no valid visit within 6  months, (last 03/19/2020) no upcoming visit scheduled, please assess. ? ? ? ?  ? ?Requested Prescriptions  ?Pending Prescriptions Disp Refills  ? metoprolol succinate (TOPROL-XL) 50 MG 24 hr tablet [Pharmacy Med Name: METOPROLOL ER SUCCINATE '50MG'$  TABS] 90 tablet 3  ?  Sig: TAKE 1 TABLET(50 MG) BY MOUTH DAILY WITH OR IMMEDIATELY FOLLOWING A MEAL  ?  ? Cardiovascular:  Beta Blockers Failed - 05/19/2021  8:21 AM  ?  ?  Failed - Last BP in normal range  ?  BP Readings from Last 1 Encounters:  ?07/25/20 (!) 145/70  ?  ?  ?  ?  Failed - Valid encounter within last 6 months  ?  Recent Outpatient Visits   ? ?      ? 1 year ago Cardiomyopathy due to hypertension, without heart failure ([place])  ? [facility] Caryn Section, Kirstie Peri, MD  ? 1 year ago Class 1 obesity without serious comorbidity with body mass index (BMI) of 32.0 to 32.9 in adult, unspecified obesity type  ? [facility] Caryn Section, Kirstie Peri, MD  ? 1 year ago Accelerated hypertension  ? [facility] Fisher, Kirstie Peri, MD  ? 2 years ago Cardiomyopathy due to hypertension, without heart failure ([place])  ? [place], FNP  ? 2 years ago Epistaxis  ? [facility] Birdie Sons, MD  ? ?  ?  ? ?  ?  ?  Passed - Last Heart Rate in normal range  ?  Pulse Readings from Last 1 Encounters:  ?07/25/20 70  ?  ?  ?  ?  ? ? ?

## 2021-05-22 NOTE — Telephone Encounter (Signed)
Last office visit was 03/19/2020. I tried calling patient to advise that he is overdue for a follow up office visit. No answer and voice message system is not set up. Will give curtesy refill for a short supply. ?

## 2021-06-01 ENCOUNTER — Other Ambulatory Visit: Payer: Self-pay

## 2021-06-01 ENCOUNTER — Emergency Department
Admission: EM | Admit: 2021-06-01 | Discharge: 2021-06-01 | Disposition: A | Discharge status: Home / Self Care | Payer: Medicaid Other | Attending: Emergency Medicine | Admitting: Emergency Medicine

## 2021-06-01 DIAGNOSIS — Z79899 Other long term (current) drug therapy: Secondary | ICD-10-CM | POA: Diagnosis not present

## 2021-06-01 DIAGNOSIS — I10 Essential (primary) hypertension: Secondary | ICD-10-CM | POA: Diagnosis not present

## 2021-06-01 DIAGNOSIS — E860 Dehydration: Secondary | ICD-10-CM | POA: Insufficient documentation

## 2021-06-01 DIAGNOSIS — R42 Dizziness and giddiness: Secondary | ICD-10-CM

## 2021-06-01 LAB — URINALYSIS, ROUTINE W REFLEX MICROSCOPIC
Bilirubin Urine: NEGATIVE
Glucose, UA: NEGATIVE mg/dL
Hgb urine dipstick: NEGATIVE
Ketones, ur: NEGATIVE mg/dL
Leukocytes,Ua: NEGATIVE
Nitrite: NEGATIVE
Protein, ur: NEGATIVE mg/dL
Specific Gravity, Urine: 1.019 (ref 1.005–1.030)
pH: 5 (ref 5.0–8.0)

## 2021-06-01 LAB — BASIC METABOLIC PANEL
Anion gap: 10 (ref 5–15)
BUN: 34 mg/dL — ABNORMAL HIGH (ref 8–23)
CO2: 26 mmol/L (ref 22–32)
Calcium: 9.3 mg/dL (ref 8.9–10.3)
Chloride: 101 mmol/L (ref 98–111)
Creatinine, Ser: 1.29 mg/dL — ABNORMAL HIGH (ref 0.61–1.24)
GFR, Estimated: >60 mL/min (ref >60)
Glucose, Bld: 114 mg/dL — ABNORMAL HIGH (ref 70–99)
Potassium: 3.2 mmol/L — ABNORMAL LOW (ref 3.5–5.1)
Sodium: 137 mmol/L (ref 135–145)

## 2021-06-01 LAB — CBC
HCT: 42.3 % (ref 39.0–52.0)
Hemoglobin: 13.9 g/dL (ref 13.0–17.0)
MCH: 31.6 pg (ref 26.0–34.0)
MCHC: 32.9 g/dL (ref 30.0–36.0)
MCV: 96.1 fL (ref 80.0–100.0)
Platelets: 242 10*3/uL (ref 150–400)
RBC: 4.4 MIL/uL (ref 4.22–5.81)
RDW: 13.3 % (ref 11.5–15.5)
WBC: 5.3 10*3/uL (ref 4.0–10.5)
nRBC: 0 % (ref 0.0–0.2)

## 2021-06-01 LAB — TROPONIN I (HIGH SENSITIVITY): Troponin I (High Sensitivity): 41 ng/L — ABNORMAL HIGH (ref <18)

## 2021-06-01 NOTE — ED Provider Notes (Signed)
? ?[facility] ?Provider Note ? ? ? Event Date/Time  ? First MD Initiated Contact with Patient 06/01/21 2018   ?  (approximate) ? ? ?History  ? ?Near Syncope and Dizziness ? ? ?HPI ? ?Terrance Carter is a 61 y.o. male with a past history of hypertension who comes ED complaining of dizziness with standing for the past 2 days.  No chest pain shortness of breath or other prodromal symptoms.  He does report that he only drinks about 2 cups of water a day, he takes antihypertensives including amlodipine, losartan, hydrochlorothiazide.  Denies any falls or trauma.  No fever, no vomiting or diarrhea.  No other symptoms, currently feels in his usual state of health. ?  ? ? ?Physical Exam  ? ?Triage Vital Signs: ?ED Triage Vitals  ?Enc Vitals Group  ?   BP 06/01/21 1907 132/79  ?   Pulse Rate 06/01/21 1907 76  ?   Resp 06/01/21 1907 20  ?   Temp 06/01/21 1907 98.3 ?F (36.8 ?C)  ?   Temp Source 06/01/21 1907 Oral  ?   SpO2 06/01/21 1907 92 %  ?   Weight 06/01/21 1908 206 lb (93.4 kg)  ?   Height 06/01/21 1908 '5\' 6"'$  (1.676 m)  ?   Head Circumference --   ?   Peak Flow --   ?   Pain Score 06/01/21 1907 0  ?   Pain Loc --   ?   Pain Edu? --   ?   Excl. in [place]? --   ? ? ?Most recent vital signs: ?Vitals:  ? 06/01/21 1907 06/01/21 1912  ?BP: 132/79   ?Pulse: 76   ?Resp: 20   ?Temp: 98.3 ?F (36.8 ?C)   ?SpO2: 92% 97%  ? ? ? ?General: Awake, no distress.  ?CV:  Good peripheral perfusion.  Regular rate and rhythm, normal distal pulses ?Resp:  Normal effort.  Clear to auscultation bilaterally ?Abd:  No distention.  ?Other:  No edema, no rash.  Moist oral mucosa ? ? ?ED Results / Procedures / Treatments  ? ?Labs ?(all labs ordered are listed, but only abnormal results are displayed) ?Labs Reviewed  ?BASIC METABOLIC PANEL - Abnormal; Notable for the following components:  ?    Result Value  ? Potassium 3.2 (*)   ? Glucose, Bld 114 (*)   ? BUN 34 (*)   ? Creatinine, Ser 1.29 (*)   ? All other components within normal  limits  ?URINALYSIS, ROUTINE W REFLEX MICROSCOPIC - Abnormal; Notable for the following components:  ? Color, Urine YELLOW (*)   ? APPearance CLEAR (*)   ? All other components within normal limits  ?TROPONIN I (HIGH SENSITIVITY) - Abnormal; Notable for the following components:  ? Troponin I (High Sensitivity) 41 (*)   ? All other components within normal limits  ?CBC  ?CBG MONITORING, ED  ?TROPONIN I (HIGH SENSITIVITY)  ? ? ? ?EKG ? ?Interpreted by me ?Normal sinus rhythm rate of 70.  Normal axis and intervals.  Normal QRS ST segments and T waves. ? ? ?RADIOLOGY ? ? ? ? ?PROCEDURES: ? ?Critical Care performed: No ? ?Procedures ? ? ?MEDICATIONS ORDERED IN ED: ?Medications - No data to display ? ? ?IMPRESSION / MDM / ASSESSMENT AND PLAN / ED COURSE  ?I reviewed the triage vital signs and the nursing notes. ?             ?               ? ?  Differential diagnosis includes, but is not limited to, dehydration, electrolyte abnormality, renal insufficiency, anemia ? ?Patient presents with dizziness with standing in the setting of inadequate water intake and antihypertensive use including a diuretic.  Labs are reassuring.  No chest pain or shortness of breath, no cardiac work-up indicated.  Does not require admission.  Patient feels well and is eager for discharge and outpatient management, will increase water intake and follow-up with his primary care doctor.  Return precautions discussed. ? ? Considering the patient's symptoms, medical history, and physical examination today, I have low suspicion for ACS, PE, TAD, pneumothorax, carditis, mediastinitis, pneumonia, CHF, or sepsis. ?Doubt stroke, intracranial hemorrhage, infection. ? ? ?  ? ? ?FINAL CLINICAL IMPRESSION(S) / ED DIAGNOSES  ? ?Final diagnoses:  ?Dizziness  ?Dehydration  ? ? ? ?Rx / DC Orders  ? ?ED Discharge Orders   ? ? None  ? ?  ? ? ? ?Note:  This document was prepared using Dragon voice recognition software and may include unintentional dictation errors. ?   ?Carrie Mew, MD ?06/01/21 2126 ? ?

## 2021-06-01 NOTE — ED Triage Notes (Signed)
Pt states he has felt dizzy for 2 days, has sometimes felt like he is going to pass out. Pt denies recent illness. Pt states he has hypertension and took his bp meds twice today and yesterday because he felt like his bp was elevated, but did not check it because he does not have a bp cuff. Pt bp 132/79 at this time.  ?

## 2021-06-02 ENCOUNTER — Telehealth: Payer: Self-pay

## 2021-06-02 NOTE — Telephone Encounter (Signed)
Transition Care Management Unsuccessful Follow-up Telephone Call ? ?Date of discharge and from where:  06/01/2021-[facility] ? ?Attempts:  1st Attempt ? ?Reason for unsuccessful TCM follow-up call:  Unable to leave message ? ?  ?

## 2021-06-03 NOTE — Telephone Encounter (Signed)
Transition Care Management Unsuccessful Follow-up Telephone Call ? ?Date of discharge and from where:  06/01/2021-[facility] ? ?Attempts:  2nd Attempt ? ?Reason for unsuccessful TCM follow-up call:  Unable to leave message ? ?  ?

## 2021-06-04 NOTE — Telephone Encounter (Signed)
Transition Care Management Unsuccessful Follow-up Telephone Call ? ?Date of discharge and from where:  06/01/2021-[facility] ? ?Attempts:  3rd Attempt ? ?Reason for unsuccessful TCM follow-up call:  Unable to leave message ? ?  ?

## 2021-08-22 ENCOUNTER — Emergency Department
Admission: EM | Admit: 2021-08-22 | Discharge: 2021-08-22 | Disposition: A | Discharge status: Home / Self Care | Payer: Medicaid Other | Attending: Emergency Medicine | Admitting: Emergency Medicine

## 2021-08-22 ENCOUNTER — Other Ambulatory Visit: Payer: Self-pay

## 2021-08-22 ENCOUNTER — Emergency Department: Payer: Medicaid Other

## 2021-08-22 DIAGNOSIS — R0789 Other chest pain: Secondary | ICD-10-CM | POA: Diagnosis not present

## 2021-08-22 DIAGNOSIS — R079 Chest pain, unspecified: Secondary | ICD-10-CM

## 2021-08-22 DIAGNOSIS — R0602 Shortness of breath: Secondary | ICD-10-CM | POA: Diagnosis not present

## 2021-08-22 LAB — CBC
HCT: 46.7 % (ref 39.0–52.0)
Hemoglobin: 15.3 g/dL (ref 13.0–17.0)
MCH: 32 pg (ref 26.0–34.0)
MCHC: 32.8 g/dL (ref 30.0–36.0)
MCV: 97.7 fL (ref 80.0–100.0)
Platelets: 230 10*3/uL (ref 150–400)
RBC: 4.78 MIL/uL (ref 4.22–5.81)
RDW: 12.6 % (ref 11.5–15.5)
WBC: 3.9 10*3/uL — ABNORMAL LOW (ref 4.0–10.5)
nRBC: 0 % (ref 0.0–0.2)

## 2021-08-22 LAB — BASIC METABOLIC PANEL
Anion gap: 6 (ref 5–15)
BUN: 24 mg/dL — ABNORMAL HIGH (ref 8–23)
CO2: 27 mmol/L (ref 22–32)
Calcium: 9.4 mg/dL (ref 8.9–10.3)
Chloride: 107 mmol/L (ref 98–111)
Creatinine, Ser: 1.43 mg/dL — ABNORMAL HIGH (ref 0.61–1.24)
GFR, Estimated: 55 mL/min — ABNORMAL LOW (ref >60)
Glucose, Bld: 115 mg/dL — ABNORMAL HIGH (ref 70–99)
Potassium: 3.4 mmol/L — ABNORMAL LOW (ref 3.5–5.1)
Sodium: 140 mmol/L (ref 135–145)

## 2021-08-22 LAB — TROPONIN I (HIGH SENSITIVITY)
Troponin I (High Sensitivity): 48 ng/L — ABNORMAL HIGH (ref <18)
Troponin I (High Sensitivity): 43 ng/L — ABNORMAL HIGH (ref <18)

## 2021-08-22 NOTE — ED Triage Notes (Signed)
Pt to ED via POV from home. Pt reports SOB and left sided CP for a few weeks that is getting worse. 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

## 2021-08-22 NOTE — ED Provider Notes (Signed)
[facility] Provider Note   Event Date/Time   First MD Initiated Contact with Patient 08/22/21 1615     (approximate) History  Chest Pain and Shortness of Breath  HPI Terrance Carter is a 61 y.o. male  Location: Left upper chest wall Duration: 3 weeks prior to arrival Timing: Intermittent and usually occurring in the morning or at night Severity: 5/10 Quality: Sharp/aching Context: Patient states that he has noticed that over the past 3 weeks he has been waking up and going to sleep with intermittent left upper chest wall pain Modifying factors: States that certain foods that exacerbate his reflux worsen this pain he denies any relieving factors other than time.  Patient denies any exertional change in this pain. Associated Symptoms: Denies ROS: Patient currently denies any vision changes, tinnitus, difficulty speaking, facial droop, sore throat, shortness of breath, abdominal pain, nausea/vomiting/diarrhea, dysuria, or weakness/numbness/paresthesias in any extremity   Physical Exam  Triage Vital Signs: ED Triage Vitals [08/22/21 1541]  Enc Vitals Group     BP (!) 147/113     Pulse Rate 86     Resp 18     Temp 98.2 F (36.8 C)     Temp Source Oral     SpO2 95 %     Weight      Height      Head Circumference      Peak Flow      Pain Score 5     Pain Loc      Pain Edu?      Excl. in [place]?    Most recent vital signs: Vitals:   08/22/21 1541 08/22/21 1711  BP: (!) 147/113 (!) 188/98  Pulse: 86 78  Resp: 18 18  Temp: 98.2 F (36.8 C)   SpO2: 95% 97%   General: Awake, oriented x4. CV:  Good peripheral perfusion.  Resp:  Normal effort.  Abd:  No distention.  Other:  Middle-aged African-American male sitting in chair at bedside in no acute distress. ED Results / Procedures / Treatments  Labs (all labs ordered are listed, but only abnormal results are displayed) Labs Reviewed  BASIC METABOLIC PANEL - Abnormal; Notable for the following  components:      Result Value   Potassium 3.4 (*)    Glucose, Bld 115 (*)    BUN 24 (*)    Creatinine, Ser 1.43 (*)    GFR, Estimated 55 (*)    All other components within normal limits  CBC - Abnormal; Notable for the following components:   WBC 3.9 (*)    All other components within normal limits  TROPONIN I (HIGH SENSITIVITY) - Abnormal; Notable for the following components:   Troponin I (High Sensitivity) 48 (*)    All other components within normal limits  TROPONIN I (HIGH SENSITIVITY)   EKG ED ECG REPORT I, Naaman Plummer, the attending physician, personally viewed and interpreted this ECG. Date: 08/22/2021 EKG Time: 1534 Rate: 86 Rhythm: normal sinus rhythm QRS Axis: normal Intervals: normal ST/T Wave abnormalities: normal Narrative Interpretation: no evidence of acute ischemia RADIOLOGY ED MD interpretation: 2 view chest x-ray interpreted by me shows no evidence of acute abnormalities including no pneumonia, pneumothorax, or widened mediastinum -Agree with radiology assessment Official radiology report(s): DG Chest 2 View  Result Date: 08/22/2021 CLINICAL DATA:  Shortness of breath and left chest pain. EXAM: CHEST - 2 VIEW COMPARISON:  07/25/2020 FINDINGS: The heart size and mediastinal contours are within normal limits.  There is no evidence of pulmonary edema, consolidation, pneumothorax, nodule or pleural fluid. The visualized skeletal structures are unremarkable. IMPRESSION: No active cardiopulmonary disease. Electronically Signed   By: Aletta Edouard M.D.   On: 08/22/2021 16:03   PROCEDURES: Critical Care performed: No .1-3 Lead EKG Interpretation  Performed by: Naaman Plummer, MD Authorized by: Naaman Plummer, MD     Interpretation: normal     ECG rate:  79   ECG rate assessment: normal     Rhythm: sinus rhythm     Ectopy: none     Conduction: normal    MEDICATIONS ORDERED IN ED: Medications - No data to display IMPRESSION / MDM / [place] /  ED COURSE  I reviewed the triage vital signs and the nursing notes.                             The patient is on the cardiac monitor to evaluate for evidence of arrhythmia and/or significant heart rate changes. Patient's presentation is most consistent with acute presentation with potential threat to life or bodily function. Workup: ECG, CXR, CBC, BMP, Troponin Findings: ECG: No overt evidence of STEMI. No evidence of Brugadas sign, delta wave, epsilon wave, significantly prolonged QTc, or malignant arrhythmia HS Troponin: Negative x1 Other Labs unremarkable for emergent problems. CXR: Without PTX, PNA, or widened mediastinum Last Stress Test:  2019 Last Heart Catheterization:  2019 HEART Score: 4  Given History, Exam, and Workup I have low suspicion for ACS, Pneumothorax, Pneumonia, Pulmonary Embolus, Tamponade, Aortic Dissection or other emergent problem as a cause for this presentation.   Reassesment: Prior to discharge patients pain was controlled and they were well appearing.  Disposition:  Discharge. Strict return precautions discussed with patient with full understanding. Advised patient to follow up promptly with primary care provider    FINAL CLINICAL IMPRESSION(S) / ED DIAGNOSES   Final diagnoses:  Nonspecific chest pain   Rx / DC Orders   ED Discharge Orders     None      Note:  This document was prepared using Dragon voice recognition software and may include unintentional dictation errors.   Naaman Plummer, MD 08/22/21 [phone]

## 2021-08-25 ENCOUNTER — Telehealth: Payer: Self-pay

## 2021-08-25 NOTE — Telephone Encounter (Signed)
Transition Care Management Follow-up Telephone Call Date of discharge and from where: 08/22/2021 from [facility] How have you been since you were released from the hospital? Patient stated that he is feeling some better and did not have any questions or concerns at this time.  Any questions or concerns? No  Items Reviewed: Did the pt receive and understand the discharge instructions provided? Yes  Medications obtained and verified? Yes  Other? No  Any new allergies since your discharge? No  Dietary orders reviewed? No Do you have support at home? Yes   Functional Questionnaire: (I = Independent and D = Dependent) ADLs: I  Bathing/Dressing- I  Meal Prep- I  Eating- I  Maintaining continence- I  Transferring/Ambulation- I  Managing Meds- I   Follow up appointments reviewed:  PCP Hospital f/u appt confirmed? Yes  Scheduled to see Lelon Huh, MD on 08/26/2021. [facility] f/u appt confirmed? No   Are transportation arrangements needed? No  If their condition worsens, is the pt aware to call PCP or go to the Emergency Dept.? Yes Was the patient provided with contact information for the PCP's office or ED? Yes Was to pt encouraged to call back with questions or concerns? Yes

## 2021-08-27 DIAGNOSIS — I209 Angina pectoris, unspecified: Secondary | ICD-10-CM | POA: Diagnosis not present

## 2021-08-27 DIAGNOSIS — R0602 Shortness of breath: Secondary | ICD-10-CM | POA: Diagnosis not present

## 2021-08-27 DIAGNOSIS — R5383 Other fatigue: Secondary | ICD-10-CM | POA: Diagnosis not present

## 2021-08-27 DIAGNOSIS — I10 Essential (primary) hypertension: Secondary | ICD-10-CM | POA: Diagnosis not present

## 2021-08-27 DIAGNOSIS — R079 Chest pain, unspecified: Secondary | ICD-10-CM | POA: Diagnosis not present

## 2021-08-27 DIAGNOSIS — C760 Malignant neoplasm of head, face and neck: Secondary | ICD-10-CM | POA: Diagnosis not present

## 2021-09-01 ENCOUNTER — Ambulatory Visit: Payer: Medicaid Other | Admitting: Family Medicine

## 2021-09-16 ENCOUNTER — Other Ambulatory Visit: Payer: Self-pay | Admitting: Family Medicine

## 2021-09-16 DIAGNOSIS — I10 Essential (primary) hypertension: Secondary | ICD-10-CM

## 2021-09-18 ENCOUNTER — Other Ambulatory Visit: Payer: Self-pay | Admitting: Family Medicine

## 2021-09-18 DIAGNOSIS — I10 Essential (primary) hypertension: Secondary | ICD-10-CM

## 2021-09-22 ENCOUNTER — Encounter: Payer: Self-pay | Admitting: Family Medicine

## 2021-09-22 ENCOUNTER — Ambulatory Visit: Payer: Medicaid Other | Admitting: Family Medicine

## 2021-09-22 DIAGNOSIS — I10 Essential (primary) hypertension: Secondary | ICD-10-CM | POA: Diagnosis not present

## 2021-09-22 MED ORDER — VALSARTAN-HYDROCHLOROTHIAZIDE 80-12.5 MG PO TABS: 2.0000 | ORAL_TABLET | Freq: Every day | ORAL | 0 refills | Status: DC

## 2021-09-22 NOTE — Patient Instructions (Signed)
Please review the attached list of medications and notify my office if there are any errors.   Please bring all of your medications to every appointment so we can make sure that our medication list is the same as yours.   Double the dose of valsartan by taking 2 of the 80-12.'5mg'$  tablets every morning until the current bottle runs out. Then we will send in a prescription for 160--'25mg'$  tablets

## 2021-09-22 NOTE — Progress Notes (Signed)
I,Roshena L Chambers,acting as a scribe for Lelon Huh, MD.,have documented all relevant documentation on the behalf of Lelon Huh, MD,as directed by  Lelon Huh, MD while in the presence of Lelon Huh, MD.    Established patient visit   Patient: Terrance Carter   DOB: 1961/02/19   61 y.o. Male  MRN: [id number] Visit Date: 09/22/2021  Today's healthcare provider: Lelon Huh, MD   Chief Complaint  Patient presents with   Hypertension   Hyperlipidemia   Subjective    HPI  Hypertension, follow-up  BP Readings from Last 3 Encounters:  09/22/21 (!) 198/90  08/22/21 (!) 170/95  06/01/21 132/79   Wt Readings from Last 3 Encounters:  09/22/21 203 lb (92.1 kg)  06/01/21 206 lb (93.4 kg)  07/25/20 205 lb 14.6 oz (93.4 kg)     He was last seen for hypertension on 03/19/2020.   BP at that visit was 136/79. Management since that visit includes continue same medication.  He reports good compliance with treatment. He is not having side effects.  He is following a Regular diet. He is not exercising. He does not smoke.  Use of agents associated with hypertension: NSAIDS.   Outside blood pressures are not checked. Symptoms: No chest pain No chest pressure  No palpitations No syncope  No dyspnea No orthopnea  No paroxysmal nocturnal dyspnea No lower extremity edema   Pertinent labs Lab Results  Component Value Date   NA 140 08/22/2021   K 3.4 (L) 08/22/2021   CREATININE 1.43 (H) 08/22/2021   GFRNONAA 55 (L) 08/22/2021   GLUCOSE 115 (H) 08/22/2021   TSH 3.300 03/19/2020       ---------------------------------------------------------------------------------------------------  Lipid/Cholesterol, Follow-up  Last lipid panel Other pertinent labs  Lab Results  Component Value Date   CHOL 188 03/19/2020   HDL 54 03/19/2020   LDLCALC 113 (H) 03/19/2020   TRIG 118 03/19/2020   CHOLHDL 3.5 03/19/2020   Lab Results  Component Value Date   ALT 21  07/25/2020   AST 28 07/25/2020   PLT 230 08/22/2021   TSH 3.300 03/19/2020     He was last seen for this 1  year  ago.  Management since that visit includes continue same medication.  He reports poor compliance with treatment. Does not take medication regularly. He is not having side effects.   Symptoms: No chest pain No chest pressure/discomfort  No dyspnea No lower extremity edema  No numbness or tingling of extremity No orthopnea  No palpitations No paroxysmal nocturnal dyspnea  No speech difficulty No syncope   Current diet: in general, an "unhealthy" diet Current exercise: none  The 10-year ASCVD risk score (Arnett DK, et al., 2019) is: 30.2%  ---------------------------------------------------------------------------------------------------   Medications: Outpatient Medications Prior to Visit  Medication Sig   amLODipine (NORVASC) 10 MG tablet TAKE 1 TABLET(10 MG) BY MOUTH DAILY   aspirin EC 81 MG tablet Take 81 mg by mouth daily.   atorvastatin (LIPITOR) 20 MG tablet Take 1 tablet (20 mg total) by mouth daily.   fluticasone (FLONASE) 50 MCG/ACT nasal spray Place 2 sprays into both nostrils daily.   metoprolol succinate (TOPROL-XL) 50 MG 24 hr tablet TAKE 1 TABLET(50 MG) BY MOUTH DAILY WITH OR IMMEDIATELY FOLLOWING A MEAL   nitroGLYCERIN (NITROSTAT) 0.4 MG SL tablet Place 1 tablet (0.4 mg total) under the tongue every 5 (five) minutes as needed for chest pain (Up to 3 tablets).   oxymetazoline (AFRIN) 0.05 % nasal spray  Place 2 sprays into both nostrils 2 (two) times daily.    valsartan-hydrochlorothiazide (DIOVAN-HCT) 80-12.5 MG tablet TAKE 1 TABLET BY MOUTH DAILY   ondansetron (ZOFRAN) 4 MG tablet Take 1 tablet (4 mg total) by mouth every 8 (eight) hours as needed for up to 10 doses for nausea or vomiting. (Patient not taking: Reported on 09/22/2021)   No facility-administered medications prior to visit.    Review of Systems  Constitutional:  Negative for appetite  change, chills and fever.  Respiratory:  Negative for chest tightness, shortness of breath and wheezing.   Cardiovascular:  Negative for chest pain and palpitations.  Gastrointestinal:  Negative for abdominal pain, nausea and vomiting.       Objective    BP (!) 198/90 (BP Location: Right Arm, Patient Position: Sitting, Cuff Size: Large)   Pulse 79   Temp 98.1 F (36.7 C) (Oral)   Resp 14   Wt 203 lb (92.1 kg)   SpO2 100%   BMI 32.77 kg/m   Today's Vitals   09/22/21 1612 09/22/21 1619  BP: (!) 198/92 (!) 198/90  Pulse: 79   Resp: 14   Temp: 98.1 F (36.7 C)   TempSrc: Oral   SpO2: 100%   Weight: 203 lb (92.1 kg)    Body mass index is 32.77 kg/m.    Physical Exam   General appearance: Mildly obese male, cooperative and in no acute distress Head: Normocephalic, without obvious abnormality, atraumatic Respiratory: Respirations even and unlabored, normal respiratory rate Extremities: All extremities are intact.  Skin: Skin color, texture, turgor normal. No rashes seen  Psych: Appropriate mood and affect. Neurologic: Mental status: Alert, oriented to person, place, and time, thought content appropriate.   Assessment & Plan     1. Accelerated hypertension Uncontrolled. Will double valsartan-hydrochlorothiazide (DIOVAN-HCT) 80-12.5 MG tablet; to take 2 tablets by mouth daily.; Refill: 0   His current bottle will run out in about 10 days. If tolerating will check renal panel and change to 160-'25mg'$  tablets.  Is being followed by Dr. Clayborn Bigness for chest pains, has stress test scheduled this week.       The entirety of the information documented in the History of Present Illness, Review of Systems and Physical Exam were personally obtained by me. Portions of this information were initially documented by the CMA and reviewed by me for thoroughness and accuracy.     Lelon Huh, MD  [facility] [phone] (phone) [phone] (fax)  [place]

## 2021-09-24 DIAGNOSIS — R0602 Shortness of breath: Secondary | ICD-10-CM | POA: Diagnosis not present

## 2021-09-24 DIAGNOSIS — R079 Chest pain, unspecified: Secondary | ICD-10-CM | POA: Diagnosis not present

## 2021-09-30 ENCOUNTER — Telehealth: Payer: Self-pay | Admitting: Family Medicine

## 2021-09-30 DIAGNOSIS — I10 Essential (primary) hypertension: Secondary | ICD-10-CM

## 2021-09-30 MED ORDER — VALSARTAN-HYDROCHLOROTHIAZIDE 160-25 MG PO TABS: 1.0000 | ORAL_TABLET | Freq: Every day | ORAL | 1 refills | Status: DC

## 2021-09-30 NOTE — Telephone Encounter (Signed)
Ok, please schedule follow up office visit in 1 month. Thanks

## 2021-09-30 NOTE — Telephone Encounter (Signed)
Patient was seen 09-22-21 and advised to double valsartan-hctz to 2 tablet daily. Please check and make sure he is tolerating this change. He should be about out and will change refill to the 160-25 dose if tolerating.

## 2021-10-02 ENCOUNTER — Telehealth: Payer: Self-pay | Admitting: Family Medicine

## 2021-10-02 NOTE — Telephone Encounter (Signed)
Patient is needing refills on Amlodipine and Metoprolol to be sent to [facility] on [address].   His appt. W/ Dr. Caryn Section is  11/03/21.

## 2021-10-03 ENCOUNTER — Other Ambulatory Visit: Payer: Self-pay

## 2021-10-03 DIAGNOSIS — I10 Essential (primary) hypertension: Secondary | ICD-10-CM

## 2021-10-03 MED ORDER — METOPROLOL SUCCINATE ER 50 MG PO TB24: ORAL_TABLET | ORAL | 0 refills | Status: DC

## 2021-10-03 MED ORDER — AMLODIPINE BESYLATE 10 MG PO TABS: ORAL_TABLET | ORAL | 0 refills | Status: DC

## 2021-10-03 NOTE — Telephone Encounter (Signed)
Rx's refilled. 

## 2021-10-14 ENCOUNTER — Ambulatory Visit: Payer: Medicaid Other | Admitting: Family Medicine

## 2021-10-20 DIAGNOSIS — R0602 Shortness of breath: Secondary | ICD-10-CM | POA: Diagnosis not present

## 2021-10-20 DIAGNOSIS — I209 Angina pectoris, unspecified: Secondary | ICD-10-CM | POA: Diagnosis not present

## 2021-10-20 DIAGNOSIS — R079 Chest pain, unspecified: Secondary | ICD-10-CM | POA: Diagnosis not present

## 2021-10-20 DIAGNOSIS — R5383 Other fatigue: Secondary | ICD-10-CM | POA: Diagnosis not present

## 2021-10-20 DIAGNOSIS — I10 Essential (primary) hypertension: Secondary | ICD-10-CM | POA: Diagnosis not present

## 2021-10-20 DIAGNOSIS — C760 Malignant neoplasm of head, face and neck: Secondary | ICD-10-CM | POA: Diagnosis not present

## 2021-11-03 ENCOUNTER — Ambulatory Visit: Payer: Medicaid Other | Admitting: Family Medicine

## 2021-11-03 NOTE — Progress Notes (Deleted)
Established patient visit   Patient: Terrance Carter   DOB: [birth year]   61 y.o. Male  MRN: [id number] Visit Date: 11/03/2021  Today's healthcare provider: Lelon Huh, MD   No chief complaint on file.  Subjective    HPI  Acceleration Hypertension, follow-up  BP Readings from Last 3 Encounters:  09/22/21 (!) 198/90  08/22/21 (!) 170/95  06/01/21 132/79   Wt Readings from Last 3 Encounters:  09/22/21 203 lb (92.1 kg)  06/01/21 206 lb (93.4 kg)  07/25/20 205 lb 14.6 oz (93.4 kg)     He was last seen for hypertension on 09/22/2021.    BP at that visit was 198/90. Management since that visit includes doubling valsartan-hydrochlorothiazide (DIOVAN-HCT) 80-12.5 MG tablet; to take 2 tablets by mouth daily. Dosage was later changed to 160-'25mg'$  tablets.   He reports {excellent/good/fair/poor:19665} compliance with treatment. He {is/is not:9024} having side effects. {document side effects if present:1} He is following a {diet:21022986} diet. He {is/is not:9024} exercising. He {does/does not:200015} smoke.  Use of agents associated with hypertension: {bp agents assoc with hypertension:511::"none"}.   Outside blood pressures are {***enter patient reported home BP readings, or 'not being checked':1}. Symptoms: {Yes/No:20286} chest pain {Yes/No:20286} chest pressure  {Yes/No:20286} palpitations {Yes/No:20286} syncope  {Yes/No:20286} dyspnea {Yes/No:20286} orthopnea  {Yes/No:20286} paroxysmal nocturnal dyspnea {Yes/No:20286} lower extremity edema   Pertinent labs Lab Results  Component Value Date   CHOL 188 03/19/2020   HDL 54 03/19/2020   LDLCALC 113 (H) 03/19/2020   TRIG 118 03/19/2020   CHOLHDL 3.5 03/19/2020   Lab Results  Component Value Date   NA 140 08/22/2021   K 3.4 (L) 08/22/2021   CREATININE 1.43 (H) 08/22/2021   GFRNONAA 55 (L) 08/22/2021   GLUCOSE 115 (H) 08/22/2021   TSH 3.300 03/19/2020     The 10-year ASCVD risk score (Arnett DK, et al., 2019) is:  20.8%  ---------------------------------------------------------------------------------------------------   Medications: Outpatient Medications Prior to Visit  Medication Sig   amLODipine (NORVASC) 10 MG tablet TAKE 1 TABLET(10 MG) BY MOUTH DAILY   aspirin EC 81 MG tablet Take 81 mg by mouth daily.   atorvastatin (LIPITOR) 20 MG tablet Take 1 tablet (20 mg total) by mouth daily. (Patient not taking: Reported on 09/22/2021)   fluticasone (FLONASE) 50 MCG/ACT nasal spray Place 2 sprays into both nostrils daily.   metoprolol succinate (TOPROL-XL) 50 MG 24 hr tablet TAKE 1 TABLET(50 MG) BY MOUTH DAILY WITH OR IMMEDIATELY FOLLOWING A MEAL   nitroGLYCERIN (NITROSTAT) 0.4 MG SL tablet Place 1 tablet (0.4 mg total) under the tongue every 5 (five) minutes as needed for chest pain (Up to 3 tablets).   ondansetron (ZOFRAN) 4 MG tablet Take 1 tablet (4 mg total) by mouth every 8 (eight) hours as needed for up to 10 doses for nausea or vomiting. (Patient not taking: Reported on 09/22/2021)   oxymetazoline (AFRIN) 0.05 % nasal spray Place 2 sprays into both nostrils 2 (two) times daily.    valsartan-hydrochlorothiazide (DIOVAN-HCT) 160-25 MG tablet Take 1 tablet by mouth daily.   No facility-administered medications prior to visit.    Review of Systems  {Labs  Heme  Chem  Endocrine  Serology  Results Review (optional):23779}   Objective    There were no vitals taken for this visit. {Show previous vital signs (optional):23777}  Physical Exam  ***  No results found for any visits on 11/03/21.  Assessment & Plan     ***  No follow-ups on file.      {  provider attestation***:1}   Lelon Huh, MD  [facility] [phone] (phone) [phone] (fax)  [place]

## 2021-11-18 ENCOUNTER — Other Ambulatory Visit: Payer: Self-pay | Admitting: Family Medicine

## 2021-11-18 DIAGNOSIS — I10 Essential (primary) hypertension: Secondary | ICD-10-CM

## 2021-12-05 ENCOUNTER — Ambulatory Visit: Payer: Medicaid Other | Admitting: Family Medicine

## 2021-12-05 ENCOUNTER — Encounter: Payer: Self-pay | Admitting: Family Medicine

## 2021-12-05 VITALS — BP 172/92 | HR 83 | Temp 98.4°F | Resp 16 | Wt 203.0 lb

## 2021-12-05 DIAGNOSIS — I43 Cardiomyopathy in diseases classified elsewhere: Secondary | ICD-10-CM | POA: Diagnosis not present

## 2021-12-05 DIAGNOSIS — I519 Heart disease, unspecified: Secondary | ICD-10-CM | POA: Diagnosis not present

## 2021-12-05 DIAGNOSIS — I119 Hypertensive heart disease without heart failure: Secondary | ICD-10-CM

## 2021-12-05 NOTE — Progress Notes (Signed)
I,Roshena L Chambers,acting as a scribe for Lelon Huh, MD.,have documented all relevant documentation on the behalf of Lelon Huh, MD,as directed by  Lelon Huh, MD while in the presence of Lelon Huh, MD.   Established patient visit   Patient: Terrance Carter   DOB: August 28, 1960   61 y.o. Male  MRN: [id number] Visit Date: 12/05/2021  Today's healthcare provider: Lelon Huh, MD   Chief Complaint  Patient presents with   Hypertension   Subjective    HPI  Hypertension, follow-up  BP Readings from Last 3 Encounters:  12/05/21 (!) 172/92  09/22/21 (!) 198/90  08/22/21 (!) 170/95   Wt Readings from Last 3 Encounters:  12/05/21 203 lb (92.1 kg)  09/22/21 203 lb (92.1 kg)  06/01/21 206 lb (93.4 kg)     He was last seen for hypertension 09/22/2021.   BP at that visit was 198/90. Management since that visit includes double valsartan-hydrochlorothiazide (DIOVAN-HCT) 80-12.5 MG tablet; to take 2 tablets by mouth daily.  He reports good compliance with treatment. He is not having side effects.  He is following a Regular diet. He is not exercising. He does not smoke.  Use of agents associated with hypertension: NSAIDS.   Outside blood pressures are not checked. Symptoms: No chest pain No chest pressure  No palpitations No syncope  No dyspnea No orthopnea  No paroxysmal nocturnal dyspnea No lower extremity edema   Pertinent labs Lab Results  Component Value Date   CHOL 188 03/19/2020   HDL 54 03/19/2020   LDLCALC 113 (H) 03/19/2020   TRIG 118 03/19/2020   CHOLHDL 3.5 03/19/2020   Lab Results  Component Value Date   NA 140 08/22/2021   K 3.4 (L) 08/22/2021   CREATININE 1.43 (H) 08/22/2021   GFRNONAA 55 (L) 08/22/2021   GLUCOSE 115 (H) 08/22/2021   TSH 3.300 03/19/2020     The 10-year ASCVD risk score (Arnett DK, et al., 2019) is: 24%  ---------------------------------------------------------------------------------------------------    Medications: Outpatient Medications Prior to Visit  Medication Sig   amLODipine (NORVASC) 10 MG tablet TAKE 1 TABLET(10 MG) BY MOUTH DAILY   aspirin EC 81 MG tablet Take 81 mg by mouth daily.   fluticasone (FLONASE) 50 MCG/ACT nasal spray Place 2 sprays into both nostrils daily.   metoprolol succinate (TOPROL-XL) 50 MG 24 hr tablet TAKE 1 TABLET(50 MG) BY MOUTH DAILY WITH OR IMMEDIATELY FOLLOWING A MEAL   nitroGLYCERIN (NITROSTAT) 0.4 MG SL tablet Place 1 tablet (0.4 mg total) under the tongue every 5 (five) minutes as needed for chest pain (Up to 3 tablets).   valsartan-hydrochlorothiazide (DIOVAN-HCT) 160-25 MG tablet Take 1 tablet by mouth daily.   atorvastatin (LIPITOR) 20 MG tablet Take 1 tablet (20 mg total) by mouth daily. (Patient not taking: Reported on 09/22/2021)   ondansetron (ZOFRAN) 4 MG tablet Take 1 tablet (4 mg total) by mouth every 8 (eight) hours as needed for up to 10 doses for nausea or vomiting. (Patient not taking: Reported on 09/22/2021)   [DISCONTINUED] oxymetazoline (AFRIN) 0.05 % nasal spray Place 2 sprays into both nostrils 2 (two) times daily.  (Patient not taking: Reported on 12/05/2021)   No facility-administered medications prior to visit.    Review of Systems  Constitutional:  Negative for appetite change, chills and fever.  Respiratory:  Negative for chest tightness, shortness of breath and wheezing.   Cardiovascular:  Negative for chest pain and palpitations.  Gastrointestinal:  Negative for abdominal pain, nausea and  vomiting.       Objective    BP (!) 172/92 (BP Location: Left Arm, Patient Position: Sitting, Cuff Size: Normal)   Pulse 83   Temp 98.4 F (36.9 C) (Oral)   Resp 16   Wt 203 lb (92.1 kg)   SpO2 100% Comment: room air  BMI 32.77 kg/m   Today's Vitals   12/05/21 1555 12/05/21 1600  BP: (!) 166/90 (!) 172/92  Pulse: 83   Resp: 16   Temp: 98.4 F (36.9 C)   TempSrc: Oral   SpO2: 100%   Weight: 203 lb (92.1 kg)    Body mass  index is 32.77 kg/m.    Physical Exam   General: Appearance:    Mildly obese male in no acute distress  Eyes:    PERRL, conjunctiva/corneas clear, EOM's intact       Lungs:     Clear to auscultation bilaterally, respirations unlabored  Heart:    Normal heart rate. Normal rhythm. No murmurs, rubs, or gallops.    MS:   All extremities are intact.    Neurologic:   Awake, alert, oriented x 3. No apparent focal neurological defect.         Assessment & Plan     1. Heart disease He is not taking atorvastatin consistently  - Lipid panel - Comprehensive metabolic panel  2. 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 Consider increasing valsartan after reviewing labs.  - Lipid panel   He declined flu vaccine today.      The entirety of the information documented in the History of Present Illness, Review of Systems and Physical Exam were personally obtained by me. Portions of this information were initially documented by the CMA and reviewed by me for thoroughness and accuracy.     Lelon Huh, MD  [facility] [phone] (phone) [phone] (fax)  [place]

## 2021-12-06 LAB — LIPID PANEL
Chol/HDL Ratio: 3.4 ratio (ref 0.0–5.0)
Cholesterol, Total: 186 mg/dL (ref 100–199)
HDL: 54 mg/dL (ref >39)
LDL Chol Calc (NIH): 112 mg/dL — ABNORMAL HIGH (ref 0–99)
Triglycerides: 112 mg/dL (ref 0–149)
VLDL Cholesterol Cal: 20 mg/dL (ref 5–40)

## 2021-12-06 LAB — COMPREHENSIVE METABOLIC PANEL
ALT: 23 IU/L (ref 0–44)
AST: 25 IU/L (ref 0–40)
Albumin/Globulin Ratio: 1.6 (ref 1.2–2.2)
Albumin: 4.7 g/dL (ref 3.9–4.9)
Alkaline Phosphatase: 80 IU/L (ref 44–121)
BUN/Creatinine Ratio: 23 (ref 10–24)
BUN: 28 mg/dL — ABNORMAL HIGH (ref 8–27)
Bilirubin Total: 0.3 mg/dL (ref 0.0–1.2)
CO2: 23 mmol/L (ref 20–29)
Calcium: 9.7 mg/dL (ref 8.6–10.2)
Chloride: 99 mmol/L (ref 96–106)
Creatinine, Ser: 1.22 mg/dL (ref 0.76–1.27)
Globulin, Total: 2.9 g/dL (ref 1.5–4.5)
Glucose: 99 mg/dL (ref 70–99)
Potassium: 3.9 mmol/L (ref 3.5–5.2)
Sodium: 140 mmol/L (ref 134–144)
Total Protein: 7.6 g/dL (ref 6.0–8.5)
eGFR: 67 mL/min/{1.73_m2} (ref >59)

## 2022-01-15 ENCOUNTER — Other Ambulatory Visit: Payer: Self-pay | Admitting: Family Medicine

## 2022-01-15 DIAGNOSIS — I10 Essential (primary) hypertension: Secondary | ICD-10-CM

## 2022-01-15 MED ORDER — VALSARTAN-HYDROCHLOROTHIAZIDE 320-25 MG PO TABS: 1.0000 | ORAL_TABLET | Freq: Every day | ORAL | 3 refills | Status: DC

## 2022-04-06 ENCOUNTER — Ambulatory Visit: Payer: Medicaid Other | Admitting: Family Medicine

## 2022-10-19 ENCOUNTER — Other Ambulatory Visit: Payer: Self-pay | Admitting: Family Medicine

## 2022-10-19 DIAGNOSIS — I10 Essential (primary) hypertension: Secondary | ICD-10-CM

## 2022-10-20 NOTE — Telephone Encounter (Signed)
Requested medication (s) are due for refill today: Yes  Requested medication (s) are on the active medication list: Yes  Last refill:  11/18/21  Future visit scheduled: No  Notes to clinic:  Unable to refill per protocol, appointment needed.      Requested Prescriptions  Pending Prescriptions Disp Refills   amLODipine (NORVASC) 10 MG tablet [Pharmacy Med Name: AMLODIPINE BESYLATE 10MG  TABLETS] 30 tablet 0    Sig: TAKE 1 TABLET(10 MG) BY MOUTH DAILY     Cardiovascular: Calcium Channel Blockers 2 Failed - 10/19/2022  9:17 AM      Failed - Last BP in normal range    BP Readings from Last 1 Encounters:  12/05/21 (!) 172/92         Failed - Valid encounter within last 6 months    Recent Outpatient Visits           10 months ago Heart disease   [place] [facility] Malva [facility], [facility]   1 year ago Accelerated hypertension   [place] [facility] Malva Limes, MD   2 years ago Cardiomyopathy due to hypertension, 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)   [place] [facility] Malva Limes, MD   2 years ago Class 1 obesity without serious comorbidity with body mass index (BMI) of 32.0 to 32.9 in adult, unspecified obesity type   [facility] Mal[facility] Limes, MD   2 years ago Accelerated hypertension   [place] [facility] Malva Limes, MD              Passed - Last Heart Rate in normal range    Pulse Readings from Last 1 Encounters:  12/05/21 83          metoprolol succinate (TOPROL-XL) 50 MG 24 hr tablet [Pharmacy Med Name: METOPROLOL ER SUCCINATE 50MG  TABS] 30 tablet 0    Sig: TAKE 1 TABLET(50 MG) BY MOUTH DAILY WITH OR IMMEDIATELY FOLLOWING A MEAL     Cardiovascular:  Beta Blockers Failed - 10/19/2022  9:17 AM      Failed - Last BP in normal range    BP Readings from Last 1 Encounters:  12/05/21 (!) 172/92         Failed - Valid encounter within last 6 months    Recent  Outpatient Visits           10 months ago Heart disease   [place] [facility] Malva Limes, MD   1 year ago Accelerated hypertension   [place] [facility] Malva Limes, MD   2 years ago Cardiomyopathy due to hypertension, without heart failure [facility])   [place] [facility] Malva Limes, MD   2 years ago Class 1 obesity without serious comorbidity with body mass index (BMI) of 32.0 to 32.9 in adult, unspecified obesity type   [facility] Malva Limes, MD   2 years ago Accelerated hypertension   [place] [facility] Malva Limes, MD              Passed - Last Heart Rate in normal range    Pulse Readings from Last 1 Encounters:  12/05/21 83

## 2022-10-20 NOTE — Telephone Encounter (Signed)
Called and spoke with patient to inform them that they are due for an appointment. Patient declined to schedule an appointment at this time. Advised patient that refill request will be sent to provider for approval. Also advised that request may not be approved without scheduling an appointment. Patient stated they would call to schedule an appointment when their schedule is free.

## 2022-12-18 ENCOUNTER — Other Ambulatory Visit: Payer: Self-pay | Admitting: Family Medicine

## 2022-12-18 DIAGNOSIS — Z1212 Encounter for screening for malignant neoplasm of rectum: Secondary | ICD-10-CM

## 2022-12-18 DIAGNOSIS — Z1211 Encounter for screening for malignant neoplasm of colon: Secondary | ICD-10-CM

## 2023-01-16 ENCOUNTER — Other Ambulatory Visit: Payer: Self-pay | Admitting: Family Medicine

## 2023-01-16 DIAGNOSIS — I10 Essential (primary) hypertension: Secondary | ICD-10-CM

## 2023-01-18 NOTE — Telephone Encounter (Signed)
Requested medication (s) are due for refill today:yes  Requested medication (s) are on the active medication list: yes  Last refill:  both reordered 11/18/21  Future visit scheduled: no  Notes to clinic:  pt has canceled the last 6 appointments   Requested Prescriptions  Pending Prescriptions Disp Refills   metoprolol succinate (TOPROL-XL) 50 MG 24 hr tablet [Pharmacy Med Name: METOPROLOL ER SUCCINATE 50MG  TABS] 30 tablet 0    Sig: TAKE 1 TABLET(50 MG) BY MOUTH DAILY WITH OR IMMEDIATELY FOLLOWING A MEAL     Cardiovascular:  Beta Blockers Failed - 01/16/2023 10:23 AM      Failed - Last BP in normal range    BP Readings from Last 1 Encounters:  12/05/21 (!) 172/92         Failed - Valid encounter within last 6 months    Recent Outpatient Visits           1 year ago Heart disease   [place] [facility] Malva Limes, MD   1 year ago Accelerated hypertension   [facility] Malva Limes, MD   2 years ago Cardiomyopathy due to hypertension, 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)   [place] [facility] Malva Limes, MD   2 years ago Class 1 obesity without serious comorbidity with body mass index (BMI) of 32.0 to 32.9 in adult, unspecified obesity type   [place] [facility] Malva Limes, MD   2 years ago Accelerated hypertension   [place] [facility] Malva Limes, MD              Passed - Last Heart Rate in normal range    Pulse Readings from Last 1 Encounters:  12/05/21 83          amLODipine (NORVASC) 10 MG tablet [Pharmacy Med Name: AMLODIPINE BESYLATE 10MG  TABLETS] 30 tablet 0    Sig: TAKE 1 TABLET(10 MG) BY MOUTH DAILY     Cardiovascular: Calcium Channel Blockers 2 Failed - 01/16/2023 10:23 AM      Failed - Last BP in normal range    BP Readings from Last 1 Encounters:  12/05/21 (!) 172/92         Failed - Valid encounter within last 6 months    Recent  Outpatient Visits           1 year ago Heart disease   [place] [facility] Malva Limes, MD   1 year ago Accelerated hypertension   [place] [facility] Malva Limes, MD   2 years ago Cardiomyopathy due to hypertension, without heart failure [facility])   [place] [facility] Malva Limes, MD   2 years ago Class 1 obesity without serious comorbidity with body mass index (BMI) of 32.0 to 32.9 in adult, unspecified obesity type   [place] [facility] Malva Limes, MD   2 years ago Accelerated hypertension   [facility] Malva Limes, MD              Passed - Last Heart Rate in normal range    Pulse Readings from Last 1 Encounters:  12/05/21 83

## 2023-01-20 ENCOUNTER — Telehealth: Payer: Self-pay | Admitting: Family Medicine

## 2023-01-20 NOTE — Telephone Encounter (Signed)
Medication: metoprolol succinate (TOPROL-XL) 50 MG 24 hr tablet [[id number]  and amLODipine (NORVASC) 10 MG tablet [[id number]]  Has the patient contacted their pharmacy? Yes   Has the prescription been filled recently? Yes  Is the patient out of the medication? Yes  Has the patient been seen for an appointment in the last year OR does the patient have an upcoming appointment? Yes  Can we respond through MyChart? No  Agent: Please be advised that Rx refills may take up to 3 business days. We ask that you follow-up with your pharmacy.  [id number] [place], [place] - [address] AT [facility] [address]  [address] [place], [place] [address]  Phone:  [phone]  Fax:  [phone]  DEA #:  [id number]

## 2023-01-20 NOTE — Telephone Encounter (Signed)
[facility] called and spoke to [employer] consultant about the refill(s) amlodipine and metoprolol requested. Advised it was sent on 01/18/23 #30/0 refill(s). She says they are ready for pickup.

## 2023-02-19 ENCOUNTER — Ambulatory Visit: Payer: Medicaid Other | Admitting: Family Medicine

## 2023-03-26 ENCOUNTER — Ambulatory Visit: Payer: Medicaid Other | Admitting: Family Medicine

## 2023-07-23 ENCOUNTER — Encounter: Payer: Self-pay | Admitting: Family Medicine

## 2023-07-23 ENCOUNTER — Ambulatory Visit (INDEPENDENT_AMBULATORY_CARE_PROVIDER_SITE_OTHER): Admitting: Family Medicine

## 2023-07-23 VITALS — BP 216/104 | HR 90 | Resp 16 | Ht 66.0 in | Wt 205.0 lb

## 2023-07-23 DIAGNOSIS — I10 Essential (primary) hypertension: Secondary | ICD-10-CM

## 2023-07-23 DIAGNOSIS — I519 Heart disease, unspecified: Secondary | ICD-10-CM | POA: Diagnosis not present

## 2023-07-23 DIAGNOSIS — Z125 Encounter for screening for malignant neoplasm of prostate: Secondary | ICD-10-CM

## 2023-07-23 MED ORDER — AMLODIPINE BESYLATE 10 MG PO TABS: 10.0000 mg | ORAL_TABLET | Freq: Every day | ORAL | 1 refills | Status: AC

## 2023-07-23 MED ORDER — METOPROLOL SUCCINATE ER 50 MG PO TB24: ORAL_TABLET | ORAL | 0 refills | Status: DC

## 2023-07-23 NOTE — Progress Notes (Unsigned)
 Established patient visit   Patient: Terrance Carter   DOB: Mar 19, 1960   63 y.o. Male  MRN: [id number] Visit Date: 07/23/2023  Today's healthcare provider: Jeralene Mom, MD   Chief Complaint  Patient presents with   Medical Management of Chronic Issues    Patient needs medication refills. He has been without medication for 3 weeks   Subjective    HPI  Patient presents for follow up not seen since 2023. Out of all his BP medications for 3 weeks and stopped atorvastatin  years ago as he thinks it was causing some side effects. Generally feels well with no chest pains or palpitations.   Medications: Outpatient Medications Prior to Visit  Medication Sig   aspirin  EC 81 MG tablet Take 81 mg by mouth daily.   fluticasone  (FLONASE ) 50 MCG/ACT nasal spray Place 2 sprays into both nostrils daily.   atorvastatin  (LIPITOR) 20 MG tablet Take 1 tablet (20 mg total) by mouth daily. (Patient not taking: Reported on 07/23/2023)   nitroGLYCERIN  (NITROSTAT ) 0.4 MG SL tablet Place 1 tablet (0.4 mg total) under the tongue every 5 (five) minutes as needed for chest pain (Up to 3 tablets).   valsartan -hydrochlorothiazide  (DIOVAN -HCT) 320-25 MG tablet Take 1 tablet by mouth daily. (Not taking)   amLODipine  (NORVASC ) 10 MG tablet TAKE 1 TABLET(10 MG) BY MOUTH DAILY (Not taking)   metoprolol  succinate (TOPROL -XL) 50 MG 24 hr tablet TAKE 1 TABLET(50 MG) BY MOUTH DAILY WITH OR IMMEDIATELY FOLLOWING A MEAL(Not taking)   [DISCONTINUED] ondansetron  (ZOFRAN ) 4 MG tablet Take 1 tablet (4 mg total) by mouth every 8 (eight) hours as needed for up to 10 doses for nausea or vomiting. (Patient not taking: Reported on 09/22/2021)   No facility-administered medications prior to visit.    Review of Systems  Constitutional:  Negative for appetite change, chills and fever.  Respiratory:  Negative for chest tightness and wheezing.   Cardiovascular:  Negative for chest pain and palpitations.  Gastrointestinal:   Negative for abdominal pain, nausea and vomiting.   {Insert previous labs (optional):23779} {See past labs  Heme  Chem  Endocrine  Serology  Results Review (optional):1}   Objective    BP (!) 216/104 (BP Location: Left Arm, Patient Position: Sitting, Cuff Size: Normal)   Pulse 90   Resp 16   Ht 5\' 6"  (1.676 m)   Wt 205 lb (93 kg)   SpO2 100%   BMI 33.09 kg/m  {Insert last BP/Wt (optional):23777}{See vitals history (optional):1}  Physical Exam   General: Appearance:    Mildly obese male in no acute distress  Eyes:    PERRL, conjunctiva/corneas clear, EOM's intact       Lungs:     Clear to auscultation bilaterally, respirations unlabored  Heart:    Normal heart rate. Normal rhythm. No murmurs, rubs, or gallops.    MS:   All extremities are intact.    Neurologic:   Awake, alert, oriented x 3. No apparent focal neurological defect.         Assessment & Plan     1. Accelerated hypertension (Primary) Off all BP meds at least three weeks.   resume amLODipine  (NORVASC ) 10 MG tablet; Take 1 tablet (10 mg total) by mouth daily. TAKE 1 TABLET(10 MG) BY MOUTH DAILY  Dispense: 90 tablet; Refill: 1 Resume  metoprolol  succinate (TOPROL -XL) 50 MG 24 hr tablet; TAKE 1 TABLET(50 MG) BY MOUTH DAILY WITH OR IMMEDIATELY FOLLOWING A MEAL  Dispense: 90 tablet; Refill:  0  - CBC - Comprehensive metabolic panel with GFR - Lipid panel - TSH  Anticipate restarting valsartan -hydrochlorothiazide  after reviewing labs.   2. Prostate cancer screening  - PSA Total (Reflex To Free)  3. Heart disease Off statin. Previously followed by Dr. Beau Bound. Checking lipids today.    Return in about 8 weeks (around 09/17/2023) for Hypertension.         Jeralene Mom, MD  [facility] [phone] (phone) [phone] (fax)  [facility]

## 2023-07-23 NOTE — Patient Instructions (Signed)
 Marland Kitchen  Please review the attached list of medications and notify my office if there are any errors.   . Please bring all of your medications to every appointment so we can make sure that our medication list is the same as yours.

## 2023-07-24 LAB — PSA TOTAL (REFLEX TO FREE): Prostate Specific Ag, Serum: 3.9 ng/mL (ref 0.0–4.0)

## 2023-07-24 LAB — LIPID PANEL
Chol/HDL Ratio: 3.2 ratio (ref 0.0–5.0)
Cholesterol, Total: 167 mg/dL (ref 100–199)
HDL: 53 mg/dL (ref >39)
LDL Chol Calc (NIH): 96 mg/dL (ref 0–99)
Triglycerides: 99 mg/dL (ref 0–149)
VLDL Cholesterol Cal: 18 mg/dL (ref 5–40)

## 2023-07-24 LAB — COMPREHENSIVE METABOLIC PANEL WITH GFR
ALT: 20 IU/L (ref 0–44)
AST: 20 IU/L (ref 0–40)
Albumin: 4.4 g/dL (ref 3.9–4.9)
Alkaline Phosphatase: 97 IU/L (ref 44–121)
BUN/Creatinine Ratio: 16 (ref 10–24)
BUN: 23 mg/dL (ref 8–27)
Bilirubin Total: 0.7 mg/dL (ref 0.0–1.2)
CO2: 24 mmol/L (ref 20–29)
Calcium: 9.4 mg/dL (ref 8.6–10.2)
Chloride: 101 mmol/L (ref 96–106)
Creatinine, Ser: 1.46 mg/dL — ABNORMAL HIGH (ref 0.76–1.27)
Globulin, Total: 2.5 g/dL (ref 1.5–4.5)
Glucose: 95 mg/dL (ref 70–99)
Potassium: 4.1 mmol/L (ref 3.5–5.2)
Sodium: 138 mmol/L (ref 134–144)
Total Protein: 6.9 g/dL (ref 6.0–8.5)
eGFR: 53 mL/min/{1.73_m2} — ABNORMAL LOW (ref >59)

## 2023-07-24 LAB — CBC
Hematocrit: 44 % (ref 37.5–51.0)
Hemoglobin: 13.9 g/dL (ref 13.0–17.7)
MCH: 31.7 pg (ref 26.6–33.0)
MCHC: 31.6 g/dL (ref 31.5–35.7)
MCV: 100 fL — ABNORMAL HIGH (ref 79–97)
RBC: 4.39 x10E6/uL (ref 4.14–5.80)
RDW: 12.8 % (ref 11.6–15.4)
WBC: 5 10*3/uL (ref 3.4–10.8)

## 2023-07-24 LAB — TSH: TSH: 2.4 u[IU]/mL (ref 0.450–4.500)

## 2023-07-26 ENCOUNTER — Other Ambulatory Visit: Payer: Self-pay | Admitting: Family Medicine

## 2023-07-26 ENCOUNTER — Ambulatory Visit: Payer: Self-pay

## 2023-07-26 DIAGNOSIS — I10 Essential (primary) hypertension: Secondary | ICD-10-CM

## 2023-07-26 MED ORDER — VALSARTAN-HYDROCHLOROTHIAZIDE 80-12.5 MG PO TABS: 1.0000 | ORAL_TABLET | Freq: Every day | ORAL | 1 refills | Status: DC

## 2023-07-26 NOTE — Telephone Encounter (Signed)
 Patient called to review labwork. Patient informed of PCP message. Pt informed he needs to follow up in 3 weeks for BP check. Pt will call back in 7-10 days to schedule BP.   Copied from CRM [id number]. Topic: Clinical - Lab/Test Results >> Jul 26, 2023  1:28 PM Ethelle Herb L wrote: Reason for CRM: Lab results given to patient. Has additional questions. Reason for Disposition  General information question, no triage required and triager able to answer question  Answer Assessment - Initial Assessment Questions 1. REASON FOR CALL or QUESTION: "What is your reason for calling today?" or "How can I best help you?" or "What question do you have that I can help answer?"     See notes  Protocols used: Information Only Call - No Triage-A-AH

## 2023-07-28 ENCOUNTER — Emergency Department
Admission: EM | Admit: 2023-07-28 | Discharge: 2023-07-29 | Disposition: A | Discharge status: Home / Self Care | Attending: Emergency Medicine | Admitting: Emergency Medicine

## 2023-07-28 ENCOUNTER — Other Ambulatory Visit: Payer: Self-pay

## 2023-07-28 ENCOUNTER — Emergency Department

## 2023-07-28 DIAGNOSIS — K5900 Constipation, unspecified: Secondary | ICD-10-CM | POA: Insufficient documentation

## 2023-07-28 NOTE — ED Provider Notes (Signed)
 [facility] Provider Note    Event Date/Time   First MD Initiated Contact with Patient 07/28/23 2300     (approximate)   History   Constipation   HPI  Terrance Carter is a 63 y.o. male   Past medical history of hypertension presents Emergency Department with constipation has not made a good bowel movement for about 4 days.  Is starting to feel bloated sensation in his abdomen.  Otherwise, denies any other acute medical complaints including dysuria, fevers, chills, GI bleeding.  Has not taken any over-the-counter therapies yet.      External Medical Documents Reviewed: Outpatient family medicine note from earlier this month documented past medical history medications      Physical Exam   Triage Vital Signs: ED Triage Vitals  Encounter Vitals Group     BP 07/28/23 2143 (!) 140/64     Systolic BP Percentile --      Diastolic BP Percentile --      Pulse Rate 07/28/23 2143 91     Resp 07/28/23 2143 16     Temp 07/28/23 2143 98.1 F (36.7 C)     Temp Source 07/28/23 2143 Oral     SpO2 07/28/23 2143 94 %     Weight 07/28/23 2144 205 lb (93 kg)     Height 07/28/23 2144 5\' 7"  (1.702 m)     Head Circumference --      Peak Flow --      Pain Score 07/28/23 2143 7     Pain Loc --      Pain Education --      Exclude from Growth Chart --     Most recent vital signs: Vitals:   07/28/23 2143 07/29/23 0055  BP: (!) 140/64 (!) 140/64  Pulse: 91 91  Resp: 16 16  Temp: 98.1 F (36.7 C) 98.1 F (36.7 C)  SpO2: 94% 94%    General: Awake, no distress.  CV:  Good peripheral perfusion.  Resp:  Normal effort.  Abd:  No distention.  Other:  Well-appearing patient in no acute distress slightly hypertensive otherwise vital signs are normal.  His abdomen is soft and nontender deep palpation all quadrants.   ED Results / Procedures / Treatments   Labs (all labs ordered are listed, but only abnormal results are displayed) Labs Reviewed - No data to  display    RADIOLOGY I independently reviewed and interpreted CT of the abdomen pelvis is no obvious obstructive or inflammatory changes I also reviewed radiologist's formal read.   PROCEDURES:  Critical Care performed: No  Procedures   MEDICATIONS ORDERED IN ED: Medications - No data to display   IMPRESSION / MDM / ASSESSMENT AND PLAN / ED COURSE  I reviewed the triage vital signs and the nursing notes.                                Patient's presentation is most consistent with acute presentation with potential threat to life or bodily function.  Differential diagnosis includes, but is not limited to, obstruction, constipation, intra-abdominal infection   MDM:   Several days of constipation, he is passing flatus so I doubt complete obstruction, and has a discomfort generalized throughout his abdomen.  Get a CT scan to rule out any surgical abdominal pathologies like infection or complete obstruction and fortunately this looks normal.  No evidence of fecal impaction that would benefit from manual  disimpaction at this time.  He looks otherwise well and so I will discharge him on a bowel regiment and have him follow-up with PMD.  He understands return with any new or worsening symptoms.       FINAL CLINICAL IMPRESSION(S) / ED DIAGNOSES   Final diagnoses:  Constipation, unspecified constipation type     Rx / DC Orders   ED Discharge Orders          Ordered    polyethylene glycol (MIRALAX) 17 g packet  2 times daily        07/29/23 0039    psyllium (METAMUCIL SMOOTH TEXTURE) 58.6 % powder  Daily        07/29/23 0039    magnesium  citrate SOLN  Daily PRN        07/29/23 0039    sodium phosphate (FLEET) ENEM  Daily PRN        07/29/23 0039             Note:  This document was prepared using Dragon voice recognition software and may include unintentional dictation errors.    Buell Carmin, MD 07/29/23 [phone]

## 2023-07-28 NOTE — ED Notes (Addendum)
 Pt up ambulating to restroom independently with steady gait. States he feels like he is going to have a BM, that he took some Miralax 2-3 hours ago at home prior to arrival. Pt provided with a specimen cup in order to obtain urine specimen if needed.

## 2023-07-28 NOTE — ED Triage Notes (Signed)
 Pt presents via POV c/o constipation x4 days. Reports last BM x5 days ago.

## 2023-07-29 MED ORDER — POLYETHYLENE GLYCOL 3350 17 G PO PACK: 17.0000 g | PACK | Freq: Two times a day (BID) | ORAL | 0 refills | Status: AC

## 2023-07-29 MED ORDER — FLEET ENEMA RE ENEM: 1.0000 | ENEMA | Freq: Every day | RECTAL | 0 refills | Status: AC | PRN

## 2023-07-29 MED ORDER — METAMUCIL SMOOTH TEXTURE 58.6 % PO POWD
1.0000 | Freq: Every day | ORAL | 12 refills | Status: AC
Start: 2023-07-29 — End: 2023-10-27

## 2023-07-29 MED ORDER — MAGNESIUM CITRATE PO SOLN: 1.0000 | Freq: Every day | ORAL | 0 refills | Status: AC | PRN

## 2023-07-29 NOTE — Discharge Instructions (Signed)
 Fortunately your CT scan did not show any dangerous conditions in your abdomen.  Take medications for constipation as prescribed.  Both the MiraLAX and the psyllium/Metamucil are safe to take daily for long-term.  The Fleet enema and magnesium  citrate solution are to be taken for severe constipation once daily only, and limited to 2 days.  Thank you for choosing us  for your health care today!  Please see your primary doctor this week for a follow up appointment.   If you have any new, worsening, or unexpected symptoms call your doctor right away or come back to the emergency department for reevaluation.  It was my pleasure to care for you today.   Arron Large Margery Sheets, MD

## 2023-08-20 ENCOUNTER — Encounter: Payer: Self-pay | Admitting: Family Medicine

## 2023-08-20 ENCOUNTER — Ambulatory Visit (INDEPENDENT_AMBULATORY_CARE_PROVIDER_SITE_OTHER): Admitting: Family Medicine

## 2023-08-20 VITALS — BP 163/89 | HR 96 | Wt 208.0 lb

## 2023-08-20 DIAGNOSIS — I119 Hypertensive heart disease without heart failure: Secondary | ICD-10-CM

## 2023-08-20 DIAGNOSIS — I10 Essential (primary) hypertension: Secondary | ICD-10-CM

## 2023-08-20 DIAGNOSIS — I43 Cardiomyopathy in diseases classified elsewhere: Secondary | ICD-10-CM | POA: Diagnosis not present

## 2023-08-20 DIAGNOSIS — Z1159 Encounter for screening for other viral diseases: Secondary | ICD-10-CM | POA: Diagnosis not present

## 2023-08-20 NOTE — Progress Notes (Signed)
 Established patient visit   Patient: Terrance Carter   DOB: 07-01-1960   63 y.o. Male  MRN: [id number] Visit Date: 08/20/2023  Today's healthcare provider: Jeralene Mom, MD   Chief Complaint  Patient presents with   Medical Management of Chronic Issues    Follow-up BP   Subjective    HPI Discussed the use of AI scribe software for clinical note transcription with the patient, who gave verbal consent to proceed.  History of Present Illness   Terrance Carter is a 63 year old male with hypertension who presents for a blood pressure check since starting back on his chronic medications 4 weeks ago. we restarted on a lower of Diovan -hctz since he had been out of his medications for so long. he is tolerating well with no side effects.   He experienced constipation which led to the hospital visit. Treatment was provided, and the issue has since resolved.  No significant breathing difficulties or shortness of breath are present, although he sometimes feels tired, particularly when climbing stairs. Previous checks indicated good oxygen levels.  He inquires about follow-up appointments with his cardiologist, Dr. Beau Bound, and an ENT specialist, Dr. Silvestre Drum, noting it has been a long time since he last saw them. He does not drive, which affects his ability to attend appointments.      BP Readings from Last 3 Encounters:  08/20/23 (!) 163/89  07/29/23 (!) 140/64  07/23/23 (!) 216/104   Lab Results  Component Value Date   CHOL 167 07/23/2023   HDL 53 07/23/2023   LDLCALC 96 07/23/2023   TRIG 99 07/23/2023   CHOLHDL 3.2 07/23/2023     Medications: Outpatient Medications Prior to Visit  Medication Sig   amLODipine  (NORVASC ) 10 MG tablet Take 1 tablet (10 mg total) by mouth daily. TAKE 1 TABLET(10 MG) BY MOUTH DAILY   aspirin  EC 81 MG tablet Take 81 mg by mouth daily.   fluticasone  (FLONASE ) 50 MCG/ACT nasal spray Place 2 sprays into both nostrils daily.   magnesium  citrate  SOLN Take 296 mLs (1 Bottle total) by mouth daily as needed for up to 2 doses for severe constipation.   metoprolol  succinate (TOPROL -XL) 50 MG 24 hr tablet TAKE 1 TABLET(50 MG) BY MOUTH DAILY WITH OR IMMEDIATELY FOLLOWING A MEAL   nitroGLYCERIN  (NITROSTAT ) 0.4 MG SL tablet Place 1 tablet (0.4 mg total) under the tongue every 5 (five) minutes as needed for chest pain (Up to 3 tablets).   polyethylene glycol (MIRALAX ) 17 g packet Take 17 g by mouth 2 (two) times daily.   psyllium (METAMUCIL SMOOTH TEXTURE) 58.6 % powder Take 1 packet by mouth daily.   sodium phosphate (FLEET) ENEM Place 133 mLs (1 enema total) rectally daily as needed for up to 2 doses for severe constipation.   valsartan -hydrochlorothiazide  (DIOVAN -HCT) 80-12.5 MG tablet Take 1 tablet by mouth daily.   atorvastatin  (LIPITOR) 20 MG tablet Take 1 tablet (20 mg total) by mouth daily. (Patient not taking: Reported on 09/22/2021)   No facility-administered medications prior to visit.    Review of Systems  Constitutional:  Negative for appetite change, chills and fever.  Respiratory:  Negative for chest tightness, shortness of breath and wheezing.   Cardiovascular:  Negative for chest pain and palpitations.  Gastrointestinal:  Negative for abdominal pain, nausea and vomiting.       Objective    BP (!) 163/89 (BP Location: Left Arm, Patient Position: Sitting, Cuff Size: Normal)   Pulse  96   Wt 208 lb (94.3 kg)   SpO2 99%   BMI 32.58 kg/m    Physical Exam   General: Appearance:    Mildly obese male in no acute distress  Eyes:    PERRL, conjunctiva/corneas clear, EOM's intact       Lungs:     Clear to auscultation bilaterally, respirations unlabored  Heart:    Normal heart rate. Normal rhythm. No murmurs, rubs, or gallops.    MS:   All extremities are intact.    Neurologic:   Awake, alert, oriented x 3. No apparent focal neurological defect.         Assessment & Plan     1. Accelerated hypertension (Primary) Much  better since restarting meds at last visit 4 weeks ago. Was previously on 320/25 of valsartan -hydrochlorothiazide   - Renal function panel  Anticipate doubling dose of valsartan -hydrochlorothiazide  if renal panel is stable.   2. 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 Beau Bound and overdue for follow up.   - Ambulatory referral to Cardiology  3. Need for hepatitis C screening test  - Hepatitis C Antibody        Jeralene Mom, MD  [facility] [phone] (phone) [phone] (fax)  [facility]

## 2023-08-22 ENCOUNTER — Ambulatory Visit: Payer: Self-pay | Admitting: Family Medicine

## 2023-08-22 DIAGNOSIS — I10 Essential (primary) hypertension: Secondary | ICD-10-CM

## 2023-08-22 LAB — RENAL FUNCTION PANEL
Albumin: 4.5 g/dL (ref 3.9–4.9)
BUN/Creatinine Ratio: 22 (ref 10–24)
BUN: 22 mg/dL (ref 8–27)
CO2: 22 mmol/L (ref 20–29)
Calcium: 9.3 mg/dL (ref 8.6–10.2)
Chloride: 105 mmol/L (ref 96–106)
Creatinine, Ser: 1.01 mg/dL (ref 0.76–1.27)
Glucose: 101 mg/dL — ABNORMAL HIGH (ref 70–99)
Phosphorus: 3 mg/dL (ref 2.8–4.1)
Potassium: 4.2 mmol/L (ref 3.5–5.2)
Sodium: 142 mmol/L (ref 134–144)
eGFR: 83 mL/min/{1.73_m2} (ref >59)

## 2023-08-22 LAB — HEPATITIS C ANTIBODY: Hep C Virus Ab: NONREACTIVE

## 2023-08-22 MED ORDER — VALSARTAN-HYDROCHLOROTHIAZIDE 160-25 MG PO TABS: 1.0000 | ORAL_TABLET | Freq: Every day | ORAL | 1 refills | Status: AC

## 2023-08-22 NOTE — Addendum Note (Signed)
 Addended by: Lamon Pillow on: 08/22/2023 09:04 AM   Modules accepted: Orders

## 2023-09-04 ENCOUNTER — Other Ambulatory Visit: Payer: Self-pay | Admitting: Family Medicine

## 2023-09-04 DIAGNOSIS — I10 Essential (primary) hypertension: Secondary | ICD-10-CM

## 2023-09-24 ENCOUNTER — Ambulatory Visit: Admitting: Family Medicine

## 2023-09-28 ENCOUNTER — Telehealth: Payer: Self-pay | Admitting: Family Medicine

## 2023-09-28 NOTE — Telephone Encounter (Signed)
 Patient has very high uncontrolled blood pressure and missed his follow up appointment last week. Please contact and reschedule appointment. And see if he needs any refills to get by until appointment.

## 2023-09-29 NOTE — Telephone Encounter (Signed)
 Na-voicemail not set up yet. Can we try to call patient later and schedule him please. Thanks.

## 2023-10-28 ENCOUNTER — Other Ambulatory Visit: Payer: Self-pay | Admitting: Family Medicine

## 2023-10-28 DIAGNOSIS — I10 Essential (primary) hypertension: Secondary | ICD-10-CM
